# Patient Record
Sex: Female | Born: 1980 | Hispanic: No | Marital: Married | Smoking: Never smoker
Health system: Southern US, Community
[De-identification: ages and names within clinical notes are randomized; demographics above are authoritative.]

## PROBLEM LIST (undated history)

## (undated) ENCOUNTER — Inpatient Hospital Stay (HOSPITAL_COMMUNITY): Payer: Self-pay

## (undated) DIAGNOSIS — Z9089 Acquired absence of other organs: Secondary | ICD-10-CM

## (undated) DIAGNOSIS — Q529 Congenital malformation of female genitalia, unspecified: Secondary | ICD-10-CM

## (undated) HISTORY — PX: ADENOIDECTOMY: SUR15

## (undated) SURGERY — Surgical Case
Anesthesia: *Unknown

---

## 2009-03-30 ENCOUNTER — Encounter: Admission: RE | Admit: 2009-03-30 | Discharge: 2009-03-30 | Payer: Self-pay | Admitting: Specialist

## 2009-06-08 ENCOUNTER — Ambulatory Visit (HOSPITAL_COMMUNITY): Admission: RE | Admit: 2009-06-08 | Discharge: 2009-06-08 | Payer: Self-pay | Admitting: Internal Medicine

## 2011-11-24 ENCOUNTER — Ambulatory Visit (HOSPITAL_COMMUNITY)
Admission: RE | Admit: 2011-11-24 | Discharge: 2011-11-24 | Disposition: A | Discharge status: Home / Self Care | Payer: 59 | Source: Ambulatory Visit | Attending: Internal Medicine | Admitting: Internal Medicine

## 2011-11-24 ENCOUNTER — Other Ambulatory Visit (HOSPITAL_COMMUNITY): Payer: Self-pay | Admitting: Internal Medicine

## 2011-11-24 DIAGNOSIS — R52 Pain, unspecified: Secondary | ICD-10-CM

## 2011-11-24 DIAGNOSIS — M25569 Pain in unspecified knee: Secondary | ICD-10-CM | POA: Insufficient documentation

## 2011-11-24 DIAGNOSIS — M25469 Effusion, unspecified knee: Secondary | ICD-10-CM | POA: Insufficient documentation

## 2013-04-10 ENCOUNTER — Encounter (HOSPITAL_COMMUNITY): Payer: Self-pay | Admitting: Pharmacist

## 2013-04-14 NOTE — H&P (Signed)
Brooke Mendoza is an 32 y.o. female with extreme pain and difficulty with intercourse.  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    Pertinent Gynecological History: Menses: irregular occurring approximately every 26-32 days without intermenstrual spotting Bleeding: dysfunctional uterine bleeding Contraception: none DES exposure: unknown Blood transfusions: none Sexually transmitted diseases: no past history Previous GYN Procedures: none  Last mammogram: n/a Date: n/a Last pap: normal Date: 03/2013 OB History: G0   Menstrual History: Menarche age: 63 No LMP recorded.    No past medical history on file.  No past surgical history on file.  No family history on file.  Social History:  has no tobacco, alcohol, and drug history on file.  Allergies: No Known Allergies  No prescriptions prior to admission    ROS  Height 5\' 5"  (1.651 m), weight 189 lb 9.5 oz (86 kg). Physical Exam  Constitutional: She is oriented to person, place, and time. She appears well-developed and well-nourished.  Cardiovascular: Normal rate and regular rhythm.   Respiratory: Effort normal and breath sounds normal.  GI: Soft.  Neurological: She is alert and oriented to person, place, and time.  Skin: Skin is warm and dry.  Psychiatric: She has a normal mood and affect. Her behavior is normal.    No results found for this or any previous visit (from the past 24 hour(s)).  No results found.  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

## 2013-04-15 ENCOUNTER — Encounter (HOSPITAL_COMMUNITY)
Admission: RE | Disposition: A | Discharge status: Home / Self Care | Payer: Self-pay | Source: Ambulatory Visit | Attending: Obstetrics & Gynecology

## 2013-04-15 ENCOUNTER — Ambulatory Visit (HOSPITAL_COMMUNITY): Payer: 59 | Admitting: Anesthesiology

## 2013-04-15 ENCOUNTER — Ambulatory Visit (HOSPITAL_COMMUNITY)
Admission: RE | Admit: 2013-04-15 | Discharge: 2013-04-15 | Disposition: A | Discharge status: Home / Self Care | Payer: 59 | Source: Ambulatory Visit | Attending: Obstetrics & Gynecology | Admitting: Obstetrics & Gynecology

## 2013-04-15 ENCOUNTER — Encounter (HOSPITAL_COMMUNITY): Payer: Self-pay | Admitting: *Deleted

## 2013-04-15 ENCOUNTER — Encounter (HOSPITAL_COMMUNITY): Payer: 59 | Admitting: Anesthesiology

## 2013-04-15 DIAGNOSIS — IMO0002 Reserved for concepts with insufficient information to code with codable children: Secondary | ICD-10-CM | POA: Insufficient documentation

## 2013-04-15 DIAGNOSIS — N898 Other specified noninflammatory disorders of vagina: Secondary | ICD-10-CM | POA: Insufficient documentation

## 2013-04-15 DIAGNOSIS — Q529 Congenital malformation of female genitalia, unspecified: Secondary | ICD-10-CM

## 2013-04-15 DIAGNOSIS — Q524 Other congenital malformations of vagina: Secondary | ICD-10-CM

## 2013-04-15 HISTORY — DX: Other congenital malformations of vagina: Q52.4

## 2013-04-15 HISTORY — PX: HYMENECTOMY: SHX5853

## 2013-04-15 HISTORY — DX: Congenital malformation of female genitalia, unspecified: Q52.9

## 2013-04-15 LAB — CBC
HCT: 39.2 % (ref 36.0–46.0)
MCV: 85.6 fL (ref 78.0–100.0)
WBC: 6.1 10*3/uL (ref 4.0–10.5)

## 2013-04-15 SURGERY — HYMENECTOMY
Anesthesia: General | Site: Vagina | Wound class: Clean Contaminated

## 2013-04-15 MED ORDER — DEXAMETHASONE SODIUM PHOSPHATE 4 MG/ML IJ SOLN
INTRAMUSCULAR | Status: DC | PRN
  Administered 2013-04-15: 10 mg via INTRAVENOUS

## 2013-04-15 MED ORDER — MEPERIDINE HCL 25 MG/ML IJ SOLN: 6.2500 mg | INTRAMUSCULAR | Status: DC | PRN

## 2013-04-15 MED ORDER — IBUPROFEN 200 MG PO TABS: 800.0000 mg | ORAL_TABLET | Freq: Four times a day (QID) | ORAL | Status: DC | PRN

## 2013-04-15 MED ORDER — KETOROLAC TROMETHAMINE 30 MG/ML IJ SOLN
INTRAMUSCULAR | Status: DC | PRN
  Administered 2013-04-15: 30 mg via INTRAVENOUS

## 2013-04-15 MED ORDER — PROPOFOL 10 MG/ML IV EMUL
INTRAVENOUS | Status: AC
  Filled 2013-04-15: qty 20

## 2013-04-15 MED ORDER — PROPOFOL 10 MG/ML IV BOLUS
INTRAVENOUS | Status: DC | PRN
  Administered 2013-04-15: 150 mg via INTRAVENOUS

## 2013-04-15 MED ORDER — FENTANYL CITRATE 0.05 MG/ML IJ SOLN
INTRAMUSCULAR | Status: AC
  Filled 2013-04-15: qty 5

## 2013-04-15 MED ORDER — LIDOCAINE HCL (CARDIAC) 20 MG/ML IV SOLN
INTRAVENOUS | Status: AC
  Filled 2013-04-15: qty 5

## 2013-04-15 MED ORDER — LIDOCAINE HCL (CARDIAC) 20 MG/ML IV SOLN
INTRAVENOUS | Status: DC | PRN
  Administered 2013-04-15: 50 mg via INTRAVENOUS

## 2013-04-15 MED ORDER — ONDANSETRON HCL 4 MG/2ML IJ SOLN
INTRAMUSCULAR | Status: AC
  Filled 2013-04-15: qty 2

## 2013-04-15 MED ORDER — LACTATED RINGERS IV SOLN
INTRAVENOUS | Status: DC
  Administered 2013-04-15 (×2): via INTRAVENOUS

## 2013-04-15 MED ORDER — MIDAZOLAM HCL 2 MG/2ML IJ SOLN
INTRAMUSCULAR | Status: AC
  Filled 2013-04-15: qty 2

## 2013-04-15 MED ORDER — KETOROLAC TROMETHAMINE 30 MG/ML IJ SOLN: 15.0000 mg | Freq: Once | INTRAMUSCULAR | Status: DC | PRN

## 2013-04-15 MED ORDER — GLYCOPYRROLATE 0.2 MG/ML IJ SOLN
INTRAMUSCULAR | Status: AC
  Administered 2013-04-15: 0.2 mg via INTRAVENOUS
  Filled 2013-04-15: qty 1

## 2013-04-15 MED ORDER — MIDAZOLAM HCL 2 MG/2ML IJ SOLN
INTRAMUSCULAR | Status: DC | PRN
  Administered 2013-04-15: 1 mg via INTRAVENOUS

## 2013-04-15 MED ORDER — FENTANYL CITRATE 0.05 MG/ML IJ SOLN
INTRAMUSCULAR | Status: DC | PRN
  Administered 2013-04-15: 25 ug via INTRAVENOUS
  Administered 2013-04-15 (×3): 50 ug via INTRAVENOUS

## 2013-04-15 MED ORDER — KETOROLAC TROMETHAMINE 30 MG/ML IJ SOLN
INTRAMUSCULAR | Status: AC
  Filled 2013-04-15: qty 1

## 2013-04-15 MED ORDER — BUPIVACAINE HCL (PF) 0.5 % IJ SOLN
INTRAMUSCULAR | Status: AC
  Filled 2013-04-15: qty 30

## 2013-04-15 MED ORDER — ONDANSETRON HCL 4 MG/2ML IJ SOLN: 4.0000 mg | Freq: Once | INTRAMUSCULAR | Status: DC | PRN

## 2013-04-15 MED ORDER — ONDANSETRON HCL 4 MG/2ML IJ SOLN
INTRAMUSCULAR | Status: DC | PRN
  Administered 2013-04-15: 4 mg via INTRAVENOUS

## 2013-04-15 MED ORDER — 0.9 % SODIUM CHLORIDE (POUR BTL) OPTIME
TOPICAL | Status: DC | PRN
  Administered 2013-04-15: 1000 mL

## 2013-04-15 MED ORDER — DEXTROSE IN LACTATED RINGERS 5 % IV SOLN
INTRAVENOUS | Status: DC
  Administered 2013-04-15: 15:00:00 via INTRAVENOUS

## 2013-04-15 MED ORDER — OXYCODONE-ACETAMINOPHEN 7.5-325 MG PO TABS: 1.0000 | ORAL_TABLET | ORAL | Status: DC | PRN

## 2013-04-15 MED ORDER — FENTANYL CITRATE 0.05 MG/ML IJ SOLN: 25.0000 ug | INTRAMUSCULAR | Status: DC | PRN

## 2013-04-15 MED ORDER — GLYCOPYRROLATE 0.2 MG/ML IJ SOLN
0.2000 mg | Freq: Once | INTRAMUSCULAR | Status: AC
  Administered 2013-04-15: 0.2 mg via INTRAVENOUS

## 2013-04-15 MED ORDER — DEXAMETHASONE SODIUM PHOSPHATE 10 MG/ML IJ SOLN
INTRAMUSCULAR | Status: AC
  Filled 2013-04-15: qty 1

## 2013-04-15 SURGICAL SUPPLY — 25 items
BLADE SURG 15 STRL LF C SS BP (BLADE) ×1 IMPLANT
BLADE SURG 15 STRL SS (BLADE) ×1
CATH ROBINSON RED A/P 16FR (CATHETERS) ×2 IMPLANT
CLOTH BEACON ORANGE TIMEOUT ST (SAFETY) ×2 IMPLANT
COUNTER NEEDLE 1200 MAGNETIC (NEEDLE) ×2 IMPLANT
ELECT REM PT RETURN 9FT ADLT (ELECTROSURGICAL) ×2
ELECTRODE REM PT RTRN 9FT ADLT (ELECTROSURGICAL) ×1 IMPLANT
GLOVE BIO SURGEON STRL SZ 6 (GLOVE) ×2 IMPLANT
GLOVE BIOGEL PI IND STRL 6 (GLOVE) ×1 IMPLANT
GLOVE BIOGEL PI IND STRL 7.0 (GLOVE) ×1 IMPLANT
GLOVE BIOGEL PI INDICATOR 6 (GLOVE) ×1
GLOVE BIOGEL PI INDICATOR 7.0 (GLOVE) ×1
GOWN STRL REIN XL XLG (GOWN DISPOSABLE) ×4 IMPLANT
NEEDLE SPNL 22GX3.5 QUINCKE BK (NEEDLE) ×2 IMPLANT
NS IRRIG 1000ML POUR BTL (IV SOLUTION) IMPLANT
PACK VAGINAL MINOR WOMEN LF (CUSTOM PROCEDURE TRAY) ×2 IMPLANT
PAD OB MATERNITY 4.3X12.25 (PERSONAL CARE ITEMS) ×2 IMPLANT
PAD PREP 24X48 CUFFED NSTRL (MISCELLANEOUS) ×2 IMPLANT
PENCIL BUTTON HOLSTER BLD 10FT (ELECTRODE) ×2 IMPLANT
SUT VIC AB 3-0 SH 27 (SUTURE) ×2
SUT VIC AB 3-0 SH 27X BRD (SUTURE) ×2 IMPLANT
SUT VICRYL RAPIDE 4/0 PS 2 (SUTURE) IMPLANT
SYR CONTROL 10ML LL (SYRINGE) ×2 IMPLANT
TOWEL OR 17X24 6PK STRL BLUE (TOWEL DISPOSABLE) ×4 IMPLANT
WATER STERILE IRR 1000ML POUR (IV SOLUTION) ×2 IMPLANT

## 2013-04-15 NOTE — Op Note (Signed)
Brooke Mendoza PROCEDURE DATE: 04/15/2013  PREOPERATIVE DIAGNOSIS: Annular hymen   POSTOPERATIVE DIAGNOSIS: The same  PROCEDURE:  Hymenectomy  SURGEON:  Dr. Mitchel Honour  INDICATIONS: Brooke Mendoza is a 32 y.o. G0 with annular hymen and dyspareunia.    FINDINGS:  Annular hymen .   ANESTHESIA:    General   ESTIMATED BLOOD LOSS: <50 cc  SPECIMENS: None  COMPLICATIONS: None immediate  PROCEDURE IN DETAIL:  The patient was taken to the operating room where general anesthesia was administered and was found to be adequate.  After an adequate timeout was performed, she was placed in the dorsal lithotomy position and examined; then prepped and draped in the sterile manner.   Her bladder was catheterized for an unmeasured amount of clear, yellow urine.  Using the Bovie knife on cut, the annular hymen was removed from the 2-10 o'clock positions.  Using 3-0 Vicryl interrupted stitches, the vestibule was reaproximated and rendered hemostatic. At the conclusion of the case, 2 finger-breadths vaginal width was noted. There was good hemostasis at the end of the procedure.  The patient tolerated the procedure well.  All counts were correct x 2.  The patient went to the recovery room in stable condition.

## 2013-04-15 NOTE — Op Note (Signed)
No change to H&P. 

## 2013-04-15 NOTE — Anesthesia Preprocedure Evaluation (Signed)
Anesthesia Evaluation  Patient identified by MRN, date of birth, ID band Patient awake    Reviewed: Allergy & Precautions, H&P , NPO status , Patient's Chart, lab work & pertinent test results  Airway Mallampati: I TM Distance: >3 FB Neck ROM: full    Dental no notable dental hx. (+) Teeth Intact   Pulmonary neg pulmonary ROS,    Pulmonary exam normal       Cardiovascular negative cardio ROS      Neuro/Psych negative neurological ROS  negative psych ROS   GI/Hepatic negative GI ROS, Neg liver ROS,   Endo/Other  negative endocrine ROS  Renal/GU negative Renal ROS  negative genitourinary   Musculoskeletal negative musculoskeletal ROS (+)   Abdominal Normal abdominal exam  (+)   Peds  Hematology negative hematology ROS (+)   Anesthesia Other Findings   Reproductive/Obstetrics negative OB ROS                           Anesthesia Physical Anesthesia Plan  ASA: I  Anesthesia Plan: General   Post-op Pain Management:    Induction: Intravenous  Airway Management Planned: LMA  Additional Equipment:   Intra-op Plan:   Post-operative Plan:   Informed Consent: I have reviewed the patients History and Physical, chart, labs and discussed the procedure including the risks, benefits and alternatives for the proposed anesthesia with the patient or authorized representative who has indicated his/her understanding and acceptance.   Dental Advisory Given  Plan Discussed with: CRNA and Surgeon  Anesthesia Plan Comments:         Anesthesia Quick Evaluation

## 2013-04-15 NOTE — Anesthesia Postprocedure Evaluation (Signed)
  Anesthesia Post-op Note  Patient: Brooke Mendoza  Procedure(s) Performed: Procedure(s): HYMENECTOMY (N/A)  Patient Location: PACU  Anesthesia Type:General  Level of Consciousness: awake, alert  and oriented  Airway and Oxygen Therapy: Patient Spontanous Breathing  Post-op Pain: none  Post-op Assessment: Post-op Vital signs reviewed, Patient's Cardiovascular Status Stable, Respiratory Function Stable, Patent Airway, No signs of Nausea or vomiting and Pain level controlled  Post-op Vital Signs: Reviewed and stable  Complications: No apparent anesthesia complications

## 2013-04-15 NOTE — Transfer of Care (Signed)
Immediate Anesthesia Transfer of Care Note  Patient: Brooke Mendoza  Procedure(s) Performed: Procedure(s): HYMENECTOMY (N/A)  Patient Location: PACU  Anesthesia Type:General  Level of Consciousness: awake, oriented and patient cooperative  Airway & Oxygen Therapy: Patient Spontanous Breathing and Patient connected to nasal cannula oxygen  Post-op Assessment: Report given to PACU RN and Post -op Vital signs reviewed and stable  Post vital signs: stable  Complications: No apparent anesthesia complications

## 2013-04-16 ENCOUNTER — Encounter (HOSPITAL_COMMUNITY): Payer: Self-pay | Admitting: Obstetrics & Gynecology

## 2014-08-21 LAB — OB RESULTS CONSOLE RPR: RPR: NONREACTIVE

## 2014-08-21 LAB — OB RESULTS CONSOLE ABO/RH: RH Type: POSITIVE

## 2014-08-21 LAB — OB RESULTS CONSOLE GC/CHLAMYDIA
Chlamydia: NEGATIVE
GC PROBE AMP, GENITAL: NEGATIVE

## 2014-08-21 LAB — OB RESULTS CONSOLE HEPATITIS B SURFACE ANTIGEN: HEP B S AG: NEGATIVE

## 2014-08-21 LAB — OB RESULTS CONSOLE RUBELLA ANTIBODY, IGM: RUBELLA: IMMUNE

## 2014-08-21 LAB — OB RESULTS CONSOLE ANTIBODY SCREEN: Antibody Screen: NEGATIVE

## 2014-08-21 LAB — OB RESULTS CONSOLE HIV ANTIBODY (ROUTINE TESTING): HIV: NONREACTIVE

## 2014-08-21 LAB — OB RESULTS CONSOLE GBS: GBS: POSITIVE

## 2014-11-25 ENCOUNTER — Encounter (HOSPITAL_COMMUNITY): Payer: Self-pay

## 2014-11-25 ENCOUNTER — Observation Stay (HOSPITAL_COMMUNITY)
Admission: AD | Admit: 2014-11-25 | Discharge: 2014-11-27 | Disposition: A | Discharge status: Home / Self Care | Payer: 59 | Source: Ambulatory Visit | Attending: Obstetrics and Gynecology | Admitting: Obstetrics and Gynecology

## 2014-11-25 DIAGNOSIS — O26872 Cervical shortening, second trimester: Principal | ICD-10-CM | POA: Insufficient documentation

## 2014-11-25 DIAGNOSIS — Z3A23 23 weeks gestation of pregnancy: Secondary | ICD-10-CM | POA: Insufficient documentation

## 2014-11-25 DIAGNOSIS — O26879 Cervical shortening, unspecified trimester: Secondary | ICD-10-CM | POA: Diagnosis present

## 2014-11-25 MED ORDER — CALCIUM CARBONATE ANTACID 500 MG PO CHEW: 2.0000 | CHEWABLE_TABLET | ORAL | Status: DC | PRN

## 2014-11-25 MED ORDER — DOCUSATE SODIUM 100 MG PO CAPS
100.0000 mg | ORAL_CAPSULE | Freq: Every day | ORAL | Status: DC
  Administered 2014-11-27: 100 mg via ORAL
  Filled 2014-11-25 (×2): qty 1

## 2014-11-25 MED ORDER — ZOLPIDEM TARTRATE 5 MG PO TABS: 5.0000 mg | ORAL_TABLET | Freq: Every evening | ORAL | Status: DC | PRN

## 2014-11-25 MED ORDER — ACETAMINOPHEN 325 MG PO TABS: 650.0000 mg | ORAL_TABLET | ORAL | Status: DC | PRN

## 2014-11-25 MED ORDER — PRENATAL MULTIVITAMIN CH
1.0000 | ORAL_TABLET | Freq: Every day | ORAL | Status: DC
  Administered 2014-11-25 – 2014-11-26 (×2): 1 via ORAL
  Filled 2014-11-25 (×3): qty 1

## 2014-11-25 NOTE — H&P (Signed)
Brooke Mendoza is a 34 y.o. female presenting for monitoring and eval of cervical shortening.  Pt has uncomplicated past history, no fertiltiy issues, normal 1st tri screen and incidentally had cervical length at 18 wk anatomy scan with Cx 3.5 cm.  Followup US  At 22 weeks had cx length of 3.14 with slight funneling.  [place] today (23 weeks) shows cx length of 2.48cm and slight funneling.   Pt denies ctxs, pressure, and reports fetal movement.  GBS+ treated with augmentin from new ob workup. History OB History    Gravida Para Term Preterm AB TAB SAB Ectopic Multiple Living   1              History reviewed. No pertinent past medical history. Past Surgical History  Procedure Laterality Date  . Adenoidectomy    . Hymenectomy N/A 04/15/2013    Procedure: HYMENECTOMY;  Surgeon: Linda Hedges, DO;  Location: [facility];  Service: Gynecology;  Laterality: N/A;   Family History: family history is not on file. Social History:  reports that she has never smoked. She has never used smokeless tobacco. She reports that she does not drink alcohol or use illicit drugs.   Prenatal Transfer Tool  Maternal Diabetes: No Genetic Screening: Normal Maternal Ultrasounds/Referrals: Normal Fetal Ultrasounds or other Referrals:  None Maternal Substance Abuse:  No Significant Maternal Medications:  None Significant Maternal Lab Results:  None Other Comments:  None  ROS    Blood pressure 103/58, pulse 55, temperature 98.2 F (36.8 C), temperature source Oral, resp. rate 18. Exam Physical Exam  Prenatal labs: ABO, Rh:   Antibody:   Rubella:   RPR:    HBsAg:    HIV:    GBS:     Assessment/Plan: IUP at 22 6/7 Cx shortening and slight funneling on [place] (asymptomatic) No risk factors Plan overnight tocometry, and [place] and consultation with MFM tomorrow.   Wilfred Siverson C 11/25/2014, 5:58 PM

## 2014-11-26 ENCOUNTER — Observation Stay (HOSPITAL_COMMUNITY): Payer: 59

## 2014-11-26 DIAGNOSIS — Z3A23 23 weeks gestation of pregnancy: Secondary | ICD-10-CM | POA: Insufficient documentation

## 2014-11-26 DIAGNOSIS — O26872 Cervical shortening, second trimester: Secondary | ICD-10-CM | POA: Diagnosis not present

## 2014-11-27 ENCOUNTER — Observation Stay (HOSPITAL_COMMUNITY): Payer: 59

## 2014-11-27 DIAGNOSIS — O26872 Cervical shortening, second trimester: Secondary | ICD-10-CM | POA: Diagnosis not present

## 2014-11-27 NOTE — Discharge Instructions (Signed)

## 2014-11-27 NOTE — Discharge Summary (Signed)
Physician Discharge Summary  Patient ID: Brooke Mendoza MRN: [id number] DOB/AGE: 26-Oct-1980 34 y.o.  Admit date: 11/25/2014 Discharge date: 11/27/2014  Admission Diagnoses:  Shortened cervical length  Discharge Diagnoses:  Active Problems:   Cervix, short (affecting pregnancy)   [redacted] weeks gestation of pregnancy   Discharged Condition: stable  Hospital Course: Pt was admitted for evaluation for PTL after shortened cervix noted on ultrasound.  Repeat US was done showing length o f3.1 with no funneling.  No ctx noted on toco.  Stable for outpatient mngt  Consults: MFM  Significant Diagnostic Studies: labs: cbc  Treatments: rest, monitoring, ultrasound  Discharge Exam: Blood pressure 95/47, pulse 64, temperature 98.3 F (36.8 C), temperature source Oral, resp. rate 18, height 5' 5.5" (1.664 m), weight 196 lb 4.8 oz (89.041 kg). General appearance: alert and cooperative GI: gravid, NT  Disposition: 01-Home or Self Care     Medication List    STOP taking these medications        ibuprofen 200 MG tablet  Commonly known as:  ADVIL     oxyCODONE-acetaminophen 7.5-325 MG per tablet  Commonly known as:  PERCOCET      TAKE these medications        prenatal multivitamin Tabs tablet  Take 1 tablet by mouth at bedtime.         Signed: Kresta Templeman 11/27/2014, 1:16 PM

## 2014-11-27 NOTE — Consult Note (Signed)
Brooke Mendoza is a 34 year old, G1 at 23 weeks 1 day gestation presenting for monitoring and eval of cervical shortening. Pt has uncomplicated past history, no fertiltiy issues, normal 1st tri screen and incidentally had cervical length at 18 wk anatomy scan with Cx 3.5 cm. Followup US At 22 weeks had cx length of 3.14 with slight funneling. [place] prior to admission (23 weeks) shows cx length of 2.48cm and slight funneling. Pt denies ctxs, pressure, and reports fetal movement. GBS+ treated with augmentin from new ob workup. Ultrasound in our office yesterday 3.1cm in length   OB History    Gravida  Para  Term  Preterm  AB  TAB  SAB  Ectopic  Multiple  Living    1               History reviewed. No pertinent past medical history. Past Surgical History   Procedure  Laterality  Date   .  Adenoidectomy     .  Hymenectomy  N/A  04/15/2013     Procedure: HYMENECTOMY; Surgeon: Linda Hedges, DO; Location: [facility]; Service: Gynecology; Laterality: N/A;    Family History: family history is not on file. Social History:  reports that she has never smoked. She has never used smokeless tobacco. She reports that she does not drink alcohol or use illicit drugs.  Assessment/Plan: IUP at 23 17 Admission for cerivcal shortening and slight funneling on [place] (asymptomatic) with a normal cervical length yesterday in our office No risk factors  Weekly evaluation of cervical length through [redacted] weeks gestation No antenatal steroids currently indicated. Not currently a candidate for progesterone therapy  Questions appear answered and precautions for the above given. Spent greater than 1/2 of 35 minute visit face to face counseling with the couple

## 2014-11-27 NOTE — Progress Notes (Signed)
MFM did [place] yest>>consult this am for OPT mgmt recs

## 2014-12-01 ENCOUNTER — Inpatient Hospital Stay (HOSPITAL_COMMUNITY)
Admission: AD | Admit: 2014-12-01 | Discharge: 2014-12-03 | DRG: 781 | Disposition: A | Discharge status: Home / Self Care | Payer: 59 | Source: Ambulatory Visit | Attending: Obstetrics & Gynecology | Admitting: Obstetrics & Gynecology

## 2014-12-01 ENCOUNTER — Encounter (HOSPITAL_COMMUNITY): Payer: Self-pay | Admitting: *Deleted

## 2014-12-01 DIAGNOSIS — O26879 Cervical shortening, unspecified trimester: Secondary | ICD-10-CM

## 2014-12-01 DIAGNOSIS — O26872 Cervical shortening, second trimester: Principal | ICD-10-CM | POA: Diagnosis present

## 2014-12-01 DIAGNOSIS — Z3A24 24 weeks gestation of pregnancy: Secondary | ICD-10-CM | POA: Diagnosis present

## 2014-12-01 DIAGNOSIS — N883 Incompetence of cervix uteri: Secondary | ICD-10-CM

## 2014-12-01 DIAGNOSIS — O99820 Streptococcus B carrier state complicating pregnancy: Secondary | ICD-10-CM | POA: Diagnosis present

## 2014-12-01 LAB — US OB FOLLOW UP

## 2014-12-01 LAB — CBC
HCT: 35.5 % — ABNORMAL LOW (ref 36.0–46.0)
HEMOGLOBIN: 12.1 g/dL (ref 12.0–15.0)
MCH: 30.7 pg (ref 26.0–34.0)
MCHC: 34.1 g/dL (ref 30.0–36.0)
MCV: 90.1 fL (ref 78.0–100.0)
Platelets: 242 10*3/uL (ref 150–400)
RBC: 3.94 MIL/uL (ref 3.87–5.11)
RDW: 13.5 % (ref 11.5–15.5)
WBC: 10.8 10*3/uL — ABNORMAL HIGH (ref 4.0–10.5)

## 2014-12-01 LAB — TYPE AND SCREEN
ABO/RH(D): AB POS
Antibody Screen: NEGATIVE

## 2014-12-01 LAB — ABO/RH: ABO/RH(D): AB POS

## 2014-12-01 MED ORDER — ZOLPIDEM TARTRATE 5 MG PO TABS: 5.0000 mg | ORAL_TABLET | Freq: Every evening | ORAL | Status: DC | PRN

## 2014-12-01 MED ORDER — PRENATAL MULTIVITAMIN CH
1.0000 | ORAL_TABLET | Freq: Every day | ORAL | Status: DC
  Administered 2014-12-01 – 2014-12-02 (×2): 1 via ORAL
  Filled 2014-12-01 (×2): qty 1

## 2014-12-01 MED ORDER — DOCUSATE SODIUM 100 MG PO CAPS
100.0000 mg | ORAL_CAPSULE | Freq: Every day | ORAL | Status: DC
  Filled 2014-12-01: qty 1

## 2014-12-01 MED ORDER — PROGESTERONE MICRONIZED 200 MG PO CAPS
200.0000 mg | ORAL_CAPSULE | Freq: Every day | ORAL | Status: DC
  Administered 2014-12-01 – 2014-12-02 (×2): 200 mg via VAGINAL
  Filled 2014-12-01 (×2): qty 1

## 2014-12-01 MED ORDER — CALCIUM CARBONATE ANTACID 500 MG PO CHEW: 2.0000 | CHEWABLE_TABLET | ORAL | Status: DC | PRN

## 2014-12-01 MED ORDER — BETAMETHASONE SOD PHOS & ACET 6 (3-3) MG/ML IJ SUSP
12.0000 mg | INTRAMUSCULAR | Status: AC
  Administered 2014-12-01 – 2014-12-02 (×2): 12 mg via INTRAMUSCULAR
  Filled 2014-12-01 (×2): qty 2

## 2014-12-01 MED ORDER — ACETAMINOPHEN 325 MG PO TABS: 650.0000 mg | ORAL_TABLET | ORAL | Status: DC | PRN

## 2014-12-01 NOTE — H&P (Signed)
Brooke Mendoza is a 34 y.o. female presenting for management of short cervix.  Patient has been followed with borderline short cervix and funneling the last month.  She was admitted one week ago and MFM consult showed CL 3.1 and patient was discharged.  Repeat CL in office today was 1.7.  Patient reports no CTX, VB or LOF and active FM.  Active FM.  Otherwise normal antepartum course.  Maternal Medical History:  Fetal activity: Perceived fetal activity is normal.   Last perceived fetal movement was within the past hour.    Prenatal complications: no prenatal complications Prenatal Complications - Diabetes: none.    OB History    Gravida Para Term Preterm AB TAB SAB Ectopic Multiple Living   1              History reviewed. No pertinent past medical history. Past Surgical History  Procedure Laterality Date  . Adenoidectomy    . Hymenectomy N/A 04/15/2013    Procedure: HYMENECTOMY;  Surgeon: Linda Hedges, DO;  Location: [facility];  Service: Gynecology;  Laterality: N/A;   Family History: family history is not on file. Social History:  reports that she has never smoked. She has never used smokeless tobacco. She reports that she does not drink alcohol or use illicit drugs.   Prenatal Transfer Tool  Maternal Diabetes: No Genetic Screening: Normal Maternal Ultrasounds/Referrals: Normal Fetal Ultrasounds or other Referrals:  Referred to Materal Fetal Medicine  Maternal Substance Abuse:  No Significant Maternal Medications:  None Significant Maternal Lab Results:  Lab values include: Group B Strep positive Other Comments:  None  ROS    Blood pressure 97/55, pulse 55, temperature 99 F (37.2 C), temperature source Oral, resp. rate 18, height 5\' 6"  (1.676 m), weight 198 lb (89.812 kg). Maternal Exam:  Abdomen: Patient reports no abdominal tenderness. Fundal height is c/w dates.       Physical Exam  Constitutional: She is oriented to person, place, and time. She appears  well-developed and well-nourished.  GI: Soft. There is no rebound and no guarding.  Neurological: She is alert and oriented to person, place, and time.  Skin: Skin is warm and dry.  Psychiatric: She has a normal mood and affect. Her behavior is normal.    Prenatal labs: ABO, Rh: --/--/AB POS (06/13 1439) Antibody: NEG (06/13 1439) Rubella:   RPR:    HBsAg:    HIV:    GBS:     Assessment/Plan: 34yo G1 at [redacted]w[redacted]d with short cervix -Admit for BMZ -Start P4 supp -Bedrest -Repeat CL with MFM consult on Wednesday -SCDs -NST q shift  Nickolette Espinola 12/01/2014, 5:15 PM

## 2014-12-02 ENCOUNTER — Inpatient Hospital Stay (HOSPITAL_COMMUNITY): Payer: 59

## 2014-12-02 ENCOUNTER — Inpatient Hospital Stay (HOSPITAL_COMMUNITY)
Admit: 2014-12-02 | Discharge: 2014-12-02 | Disposition: A | Discharge status: Home / Self Care | Payer: 59 | Attending: Obstetrics and Gynecology | Admitting: Obstetrics and Gynecology

## 2014-12-02 ENCOUNTER — Ambulatory Visit (HOSPITAL_COMMUNITY)
Admit: 2014-12-02 | Discharge: 2014-12-02 | Disposition: A | Discharge status: Home / Self Care | Payer: 59 | Attending: Obstetrics and Gynecology | Admitting: Obstetrics and Gynecology

## 2014-12-02 NOTE — Consult Note (Signed)
Maternal Fetal Medicine Consultation  Requesting Provider(s): Brooke Queen, MD  Reason for consultation: Cervical shortening at 23+ weeks gestation  HPI: Brooke Mendoza is a 34 yo G1P0, EDD 03/25/2015 who is currently at 23w 6d admitted for a course of betamethasone due to shortened cervix.  See previous consult by Dr. Lawerance Cruel.  At the time of her anatomy ultrasound, some cervical funneling was noted.  Subsequent ultrasound showed a mildly shortened cervix of 2.48 cm.  Transvaginal ultrasound with MFM approximately one week ago showed a normal cervical length of 3.1 cm.  Yesterday, clinic ultrasound showed a cervical length of 1.7 cm with funneling.  The patient was admitted for a course of betamethasone and was started on vaginal progesterone.  She denies uterine contractions, vaginal bleeding or leakage of fluid.  She has been stable since admission and is currently without complaints.  Her prenatal course has otherwise been uncomplicated.  OB History: OB History    Gravida Para Term Preterm AB TAB SAB Ectopic Multiple Living   1               PMH: No past medical history on file.  PSH:  Past Surgical History  Procedure Laterality Date  . Adenoidectomy    . Hymenectomy N/A 04/15/2013    Procedure: HYMENECTOMY;  Surgeon: Linda Hedges, DO;  Location: [facility];  Service: Gynecology;  Laterality: N/A;   Meds:  Current Facility-Administered Medications on File Prior to Encounter  Medication Dose Route Frequency Provider Last Rate Last Dose  . acetaminophen (TYLENOL) tablet 650 mg  650 mg Oral Q4H PRN Linda Hedges, DO      . calcium carbonate (TUMS - dosed in mg elemental calcium) chewable tablet 400 mg of elemental calcium  2 tablet Oral Q4H PRN Linda Hedges, DO      . docusate sodium (COLACE) capsule 100 mg  100 mg Oral Daily Megan Morris, DO   100 mg at 12/01/14 1517  . prenatal multivitamin tablet 1 tablet  1 tablet Oral Q1200 Megan Morris, DO   1 tablet at 12/01/14 2132  .  progesterone (PROMETRIUM) capsule 200 mg  200 mg Vaginal QHS Megan Morris, DO   200 mg at 12/01/14 2132  . zolpidem (AMBIEN) tablet 5 mg  5 mg Oral QHS PRN Linda Hedges, DO       Current Outpatient Prescriptions on File Prior to Encounter  Medication Sig Dispense Refill  . Prenatal Vit-Fe Fumarate-FA (PRENATAL MULTIVITAMIN) TABS tablet Take 1 tablet by mouth at bedtime.     Allergies: No Known Allergies   FH: No family history on file.  Soc:  History   Social History  . Marital Status: Married    Spouse Name: N/A  . Number of Children: N/A  . Years of Education: N/A   Occupational History  . Not on file.   Social History Main Topics  . Smoking status: Never Smoker   . Smokeless tobacco: Never Used  . Alcohol Use: No  . Drug Use: No  . Sexual Activity: Yes   Other Topics Concern  . Not on file   Social History Narrative    GEN: well-appearing female ABD: gravid, NT  Ultrasound: Single IUP at 23w 6d Limited ultrasound performed for cervical length TVUS - cervical length 1.4 cm; some V-shaped funneling noted at the internal os Normal amniotic fluid volume  Labs: CBC    Component Value Date/Time   WBC 10.8* 12/01/2014 1439   RBC 3.94 12/01/2014 1439   HGB 12.1 12/01/2014  1439   HCT 35.5* 12/01/2014 1439   PLT 242 12/01/2014 1439   MCV 90.1 12/01/2014 1439   MCH 30.7 12/01/2014 1439   MCHC 34.1 12/01/2014 1439   RDW 13.5 12/01/2014 1439     A/P: 1) Single IUP at 23w 6d  2) Cervical shortening at 23w 6d - cervical length overall stable since admission. The patient is currently asymptomatic.  We had a long discussion regarding shortened cervix and management.  Based on gestational age, she is not a candidate for cerclage.  We briefly reviewed recent medical literature regarding pessary.  While initial studies showed some benefit in shortened cervix, more recent randomized controlled studies in [place] have shown no benefit with pessary.  The patient is clearly at  increased risk for preterm delivery, but the positive predictive value for shortened cervical length is poor - and she could go on to delivery at a much later gestational age.  Concur with addition of vaginal progesterone and the course of betamethasone.  Recommendations: 1) The patient and her husband had many questions regarding newborn outcomes based on gestational age.  While I do not feel that she is at imminent risk of delivery at this time, she would benefit from a NICU consult to discuss this further. 2) If the patient remains stable, would consider hospital discharge tomorrow after completing betamethasone.  The patient and her husband requested another cervical length tomorrow prior to discharge and feel that this would be reasonable.  If the cervical length is significantly shorted, would continue inpatient management / observation. 3) Recommend weekly cervical length surveillance until approx 26 weeks.  If the cervical length is significantly shorted (< 1 cm or less) or becomes symptomatic, would consider readmission.     Thank you for the opportunity to be a part of the care of [name]. Please contact our office if we can be of further assistance.   I spent approximately 30 minutes with this patient with over 50% of time spent in face-to-face counseling.  Benjaman Lobe, MD Maternal Fetal Medicine

## 2014-12-02 NOTE — Progress Notes (Signed)
Patient is doing well. No complaints.    BP 103/59 mmHg  Pulse 57  Temp(Src) 98.7 F (37.1 C) (Oral)  Resp 18  Ht 5\' 6"  (1.676 m)  Wt 89.812 kg (198 lb)  BMI 31.97 kg/m2 Results for orders placed or performed during the hospital encounter of 12/01/14 (from the past 24 hour(s))  CBC     Status: Abnormal   Collection Time: 12/01/14  2:39 PM  Result Value Ref Range   WBC 10.8 (H) 4.0 - 10.5 K/uL   RBC 3.94 3.87 - 5.11 MIL/uL   Hemoglobin 12.1 12.0 - 15.0 g/dL   HCT 35.5 (L) 36.0 - 46.0 %   MCV 90.1 78.0 - 100.0 fL   MCH 30.7 26.0 - 34.0 pg   MCHC 34.1 30.0 - 36.0 g/dL   RDW 13.5 11.5 - 15.5 %   Platelets 242 150 - 400 K/uL  Type and screen     Status: None   Collection Time: 12/01/14  2:39 PM  Result Value Ref Range   ABO/RH(D) AB POS    Antibody Screen NEG    Sample Expiration 12/04/2014   ABO/Rh     Status: None   Collection Time: 12/01/14  2:39 PM  Result Value Ref Range   ABO/RH(D) AB POS    General alert and oriented Sitting up and eating breakfast  IMPRESSION: IUP at 23 weeks Shortened cervix  PLAN: Continue bedrest Complete steroid series Continue progesterone MFM consult tomorrow to discuss inpatient versus outpatient management

## 2014-12-02 NOTE — Progress Notes (Signed)
Patient visiting with family and wished to have VS and be placed on monitor held until after they left.

## 2014-12-03 ENCOUNTER — Other Ambulatory Visit (HOSPITAL_COMMUNITY): Payer: Self-pay | Admitting: Obstetrics and Gynecology

## 2014-12-03 ENCOUNTER — Inpatient Hospital Stay (HOSPITAL_COMMUNITY)
Admit: 2014-12-03 | Discharge: 2014-12-03 | Disposition: A | Discharge status: Home / Self Care | Payer: 59 | Attending: Obstetrics and Gynecology | Admitting: Obstetrics and Gynecology

## 2014-12-03 DIAGNOSIS — N883 Incompetence of cervix uteri: Secondary | ICD-10-CM | POA: Insufficient documentation

## 2014-12-03 DIAGNOSIS — Z3A24 24 weeks gestation of pregnancy: Secondary | ICD-10-CM | POA: Insufficient documentation

## 2014-12-03 DIAGNOSIS — O26872 Cervical shortening, second trimester: Secondary | ICD-10-CM

## 2014-12-03 MED ORDER — PROGESTERONE MICRONIZED 200 MG PO CAPS: 200.0000 mg | ORAL_CAPSULE | Freq: Every day | ORAL | Status: DC

## 2014-12-03 NOTE — Discharge Instructions (Signed)
Pelvic Rest Pelvic rest is sometimes recommended for women when:   The placenta is partially or completely covering the opening of the cervix (placenta previa).  There is bleeding between the uterine wall and the amniotic sac in the first trimester (subchorionic hemorrhage).  The cervix begins to open without labor starting (incompetent cervix, cervical insufficiency).  The labor is too early (preterm labor). HOME CARE INSTRUCTIONS  Do not have sexual intercourse, stimulation, or an orgasm.  Do not use tampons, douche, or put anything in the vagina.  Do not lift anything over 10 pounds (4.5 kg).  Avoid strenuous activity or straining your pelvic muscles. SEEK MEDICAL CARE IF:  You have any vaginal bleeding during pregnancy. Treat this as a potential emergency.  You have cramping pain felt low in the stomach (stronger than menstrual cramps).  You notice vaginal discharge (watery, mucus, or bloody).  You have a low, dull backache.  There are regular contractions or uterine tightening. SEEK IMMEDIATE MEDICAL CARE IF: You have vaginal bleeding and have placenta previa.  Document Released: 10/01/2010 Document Revised: 08/29/2011 Document Reviewed: 10/01/2010 [facility] Patient Information 2015 [place], [place]. This information is not intended to replace advice given to you by your health care provider. Make sure you discuss any questions you have with your health care provider.

## 2014-12-03 NOTE — Discharge Summary (Signed)
Physician Discharge Summary  Patient ID: Brooke Mendoza MRN: [id number] DOB/AGE: 33-Jan-1982 33 y.o.  Admit date: 12/01/2014 Discharge date: 12/03/2014  Admission Diagnoses:cervical shortening  Discharge Diagnoses:  Active Problems:   Cervical shortening affecting pregnancy in second trimester, antepartum   Discharged Condition: stable  Hospital Course: admitted for observation and betamethasone. No bleeding/leaking/cramping. Repeat U/S today with MFM shows 2.7 cm cervix. Dr Lisbeth Renshaw of MFM agrees with discharge.  Consults: MFM  Significant Diagnostic Studies: radiology: Radiology  Treatments: betamethasone  Discharge Exam: Blood pressure 108/55, pulse 50, temperature 97.7 F (36.5 C), temperature source Oral, resp. rate 16, height 5\' 6"  (1.676 m), weight 198 lb 3.2 oz (89.903 kg). General appearance: alert, cooperative and no distress  Disposition: 01-Home or Self Care     Medication List    TAKE these medications        prenatal multivitamin Tabs tablet  Take 1 tablet by mouth at bedtime.     progesterone 200 MG capsule  Commonly known as:  PROMETRIUM  Place 1 capsule (200 mg total) vaginally at bedtime.         Signed: Ermalee Mealy II,Kimori Tartaglia E 12/03/2014, 9:23 AM

## 2014-12-10 ENCOUNTER — Other Ambulatory Visit (HOSPITAL_COMMUNITY): Payer: Self-pay | Admitting: Obstetrics and Gynecology

## 2014-12-10 ENCOUNTER — Ambulatory Visit (HOSPITAL_COMMUNITY)
Admission: RE | Admit: 2014-12-10 | Discharge: 2014-12-10 | Disposition: A | Discharge status: Home / Self Care | Payer: 59 | Source: Ambulatory Visit | Attending: Obstetrics and Gynecology | Admitting: Obstetrics and Gynecology

## 2014-12-10 ENCOUNTER — Encounter (HOSPITAL_COMMUNITY): Payer: Self-pay

## 2014-12-10 DIAGNOSIS — O26872 Cervical shortening, second trimester: Secondary | ICD-10-CM

## 2014-12-10 DIAGNOSIS — Z3A25 25 weeks gestation of pregnancy: Secondary | ICD-10-CM | POA: Diagnosis not present

## 2014-12-11 ENCOUNTER — Other Ambulatory Visit (HOSPITAL_COMMUNITY): Payer: Self-pay | Admitting: Obstetrics & Gynecology

## 2014-12-11 ENCOUNTER — Encounter (HOSPITAL_COMMUNITY): Payer: Self-pay | Admitting: Obstetrics & Gynecology

## 2014-12-12 ENCOUNTER — Other Ambulatory Visit (HOSPITAL_COMMUNITY): Payer: Self-pay | Admitting: Obstetrics and Gynecology

## 2014-12-12 ENCOUNTER — Ambulatory Visit (HOSPITAL_COMMUNITY)
Admission: RE | Admit: 2014-12-12 | Discharge: 2014-12-12 | Disposition: A | Discharge status: Home / Self Care | Payer: 59 | Source: Ambulatory Visit | Attending: Obstetrics and Gynecology | Admitting: Obstetrics and Gynecology

## 2014-12-12 DIAGNOSIS — O26872 Cervical shortening, second trimester: Secondary | ICD-10-CM | POA: Diagnosis not present

## 2014-12-19 ENCOUNTER — Ambulatory Visit (HOSPITAL_COMMUNITY)
Admission: RE | Admit: 2014-12-19 | Discharge: 2014-12-19 | Disposition: A | Discharge status: Home / Self Care | Payer: 59 | Source: Ambulatory Visit | Attending: Obstetrics and Gynecology | Admitting: Obstetrics and Gynecology

## 2014-12-19 ENCOUNTER — Encounter (HOSPITAL_COMMUNITY): Payer: Self-pay

## 2014-12-19 DIAGNOSIS — Z3A26 26 weeks gestation of pregnancy: Secondary | ICD-10-CM | POA: Diagnosis not present

## 2014-12-19 DIAGNOSIS — O26872 Cervical shortening, second trimester: Secondary | ICD-10-CM | POA: Insufficient documentation

## 2014-12-22 ENCOUNTER — Inpatient Hospital Stay (HOSPITAL_COMMUNITY): Payer: 59

## 2014-12-22 ENCOUNTER — Inpatient Hospital Stay (HOSPITAL_COMMUNITY)
Admission: AD | Admit: 2014-12-22 | Discharge: 2014-12-22 | Disposition: A | Discharge status: Home / Self Care | Payer: 59 | Source: Ambulatory Visit | Attending: Obstetrics and Gynecology | Admitting: Obstetrics and Gynecology

## 2014-12-22 ENCOUNTER — Encounter (HOSPITAL_COMMUNITY): Payer: Self-pay | Admitting: *Deleted

## 2014-12-22 DIAGNOSIS — Z3A26 26 weeks gestation of pregnancy: Secondary | ICD-10-CM | POA: Diagnosis not present

## 2014-12-22 DIAGNOSIS — O26872 Cervical shortening, second trimester: Secondary | ICD-10-CM | POA: Insufficient documentation

## 2014-12-22 DIAGNOSIS — R109 Unspecified abdominal pain: Secondary | ICD-10-CM

## 2014-12-22 DIAGNOSIS — O9989 Other specified diseases and conditions complicating pregnancy, childbirth and the puerperium: Secondary | ICD-10-CM | POA: Diagnosis not present

## 2014-12-22 DIAGNOSIS — R102 Pelvic and perineal pain: Secondary | ICD-10-CM | POA: Diagnosis not present

## 2014-12-22 DIAGNOSIS — O26899 Other specified pregnancy related conditions, unspecified trimester: Secondary | ICD-10-CM

## 2014-12-22 LAB — URINALYSIS, ROUTINE W REFLEX MICROSCOPIC
BILIRUBIN URINE: NEGATIVE
GLUCOSE, UA: NEGATIVE mg/dL
HGB URINE DIPSTICK: NEGATIVE
Ketones, ur: NEGATIVE mg/dL
Leukocytes, UA: NEGATIVE
Nitrite: NEGATIVE
Protein, ur: NEGATIVE mg/dL
Urobilinogen, UA: 0.2 mg/dL (ref 0.0–1.0)
pH: 6 (ref 5.0–8.0)

## 2014-12-22 NOTE — MAU Provider Note (Signed)
History     CSN: [id number]  Arrival date and time: 12/22/14 1000   First Provider Initiated Contact with Patient 12/22/14 1045      Chief Complaint  Patient presents with  . Abdominal Pain   HPI Brooke Mendoza 34 y.o. [redacted]w[redacted]d  Comes to MAU with constant pelvic heaviness and pressure since Saturday.  Feels the baby moving frequently.  Has noted pressure a couple of times a day previously but since Saturday, it has been almost constant.  Denies any leaking of fluid or vaginal bleeding.  Has notice the baby "balling up" a couple of times a day and that has not changed in the last week.  Unsure about contractions and discussed with client.  Denies any cramping in the lower abdomen.  Additionally has left sided sciatica pain going down the back of her left hip and back of left leg to the level of her knee.  Feels constantly sore and when moving in bed has some shooting pain.  Had sciatica a year ago when she was not pregnant.  Improved with change in physical activity - less stressful exercise and cleaning at home.  Accompanied by her mother and husband.  OB History    Gravida Para Term Preterm AB TAB SAB Ectopic Multiple Living   1               History reviewed. No pertinent past medical history.  Past Surgical History  Procedure Laterality Date  . Adenoidectomy    . Hymenectomy N/A 04/15/2013    Procedure: HYMENECTOMY;  Surgeon: Linda Hedges, DO;  Location: [facility];  Service: Gynecology;  Laterality: N/A;    History reviewed. No pertinent family history.  History  Substance Use Topics  . Smoking status: Never Smoker   . Smokeless tobacco: Never Used  . Alcohol Use: No    Allergies: No Known Allergies  Prescriptions prior to admission  Medication Sig Dispense Refill Last Dose  . Prenatal Vit-Fe Fumarate-FA (PRENATAL MULTIVITAMIN) TABS tablet Take 1 tablet by mouth at bedtime.   12/21/2014 at Unknown time  . progesterone (PROMETRIUM) 200 MG capsule Place 1 capsule (200 mg  total) vaginally at bedtime. 30 capsule 3 12/21/2014 at Unknown time    Review of Systems  Constitutional: Negative for fever.  Gastrointestinal: Negative for nausea and vomiting.       Has pelvic pressure and heaviness  Genitourinary:       No vaginal discharge. No vaginal bleeding. No leaking of fluid. No dysuria.   Physical Exam   Blood pressure 112/64, pulse 57, temperature 98.1 F (36.7 C), temperature source Oral, resp. rate 16, height 5\' 5"  (1.651 m), weight 200 lb 4 oz (90.833 kg), last menstrual period 06/18/2014.  Physical Exam  Nursing note and vitals reviewed. Constitutional: She is oriented to person, place, and time. She appears well-developed and well-nourished.  HENT:  Head: Normocephalic.  Eyes: EOM are normal.  Neck: Neck supple.  GI: Soft. There is no tenderness.  On monitor and FHT has moderate variability.  No contractions palpated and no contractions seen on the monitor strip.  Musculoskeletal: Normal range of motion.  Neurological: She is alert and oriented to person, place, and time.  Skin: Skin is warm and dry.  Psychiatric: She has a normal mood and affect.    MAU Course  Procedures  MDM 1105 - consult with Dr. Helane Rima by phone and reviewed the plan of care.  Will have [place] to check cervical length. Reviewed both ultrasounds  -  today's and Friday's with Dr. Helane Rima by phone - OK to discharge  Assessment and Plan  Pelvic pressure  Cervical shortening in second trimester   Plan Cervix is essentially the same as on Friday.  Reviewed with client.  From the slight changes and from the shortening of the cervix at this time in pregnancy, preterm delivery will likely occur.  Emphasized that every day the baby is in the womb is beneficial to the baby. Advised to continue the plan of care already established. Be aware of symptoms and if they worsen, be seen in the office or return to the hospital. Can use an ice pack or a heating pad for  sciatica.   Ellyse Rotolo 12/22/2014, 11:04 AM

## 2014-12-22 NOTE — MAU Note (Signed)
Pt back from U/S, no further EFM needed per Butch Penny NP

## 2014-12-22 NOTE — Discharge Instructions (Signed)
Continue the instructions you have been given in this pregnancy. Call your doctor or return with any worsening symptoms.

## 2014-12-22 NOTE — MAU Note (Signed)
Patient presents at [redacted] weeks gestation with c/o abdominal pain since Saturday. Fetus active. Denies bleeding or discharge.

## 2014-12-25 ENCOUNTER — Other Ambulatory Visit (HOSPITAL_COMMUNITY): Payer: Self-pay | Admitting: Maternal and Fetal Medicine

## 2014-12-25 DIAGNOSIS — O3412 Maternal care for benign tumor of corpus uteri, second trimester: Secondary | ICD-10-CM

## 2014-12-25 DIAGNOSIS — O26872 Cervical shortening, second trimester: Secondary | ICD-10-CM

## 2014-12-25 DIAGNOSIS — D259 Leiomyoma of uterus, unspecified: Secondary | ICD-10-CM

## 2014-12-26 ENCOUNTER — Encounter (HOSPITAL_COMMUNITY): Payer: Self-pay

## 2014-12-26 ENCOUNTER — Other Ambulatory Visit (HOSPITAL_COMMUNITY): Payer: Self-pay | Admitting: Maternal and Fetal Medicine

## 2014-12-26 ENCOUNTER — Ambulatory Visit (HOSPITAL_COMMUNITY)
Admission: RE | Admit: 2014-12-26 | Discharge: 2014-12-26 | Disposition: A | Discharge status: Home / Self Care | Payer: 59 | Source: Ambulatory Visit | Attending: Obstetrics and Gynecology | Admitting: Obstetrics and Gynecology

## 2014-12-26 DIAGNOSIS — O26872 Cervical shortening, second trimester: Secondary | ICD-10-CM | POA: Insufficient documentation

## 2014-12-26 DIAGNOSIS — D259 Leiomyoma of uterus, unspecified: Secondary | ICD-10-CM | POA: Insufficient documentation

## 2014-12-26 DIAGNOSIS — O3410 Maternal care for benign tumor of corpus uteri, unspecified trimester: Secondary | ICD-10-CM

## 2014-12-26 DIAGNOSIS — O3412 Maternal care for benign tumor of corpus uteri, second trimester: Secondary | ICD-10-CM

## 2014-12-26 DIAGNOSIS — O365920 Maternal care for other known or suspected poor fetal growth, second trimester, not applicable or unspecified: Secondary | ICD-10-CM | POA: Insufficient documentation

## 2014-12-26 DIAGNOSIS — Z3A00 Weeks of gestation of pregnancy not specified: Secondary | ICD-10-CM | POA: Insufficient documentation

## 2014-12-26 DIAGNOSIS — Z3A27 27 weeks gestation of pregnancy: Secondary | ICD-10-CM | POA: Insufficient documentation

## 2015-01-02 ENCOUNTER — Ambulatory Visit (HOSPITAL_COMMUNITY)
Admission: RE | Admit: 2015-01-02 | Discharge: 2015-01-02 | Disposition: A | Discharge status: Home / Self Care | Payer: 59 | Source: Ambulatory Visit | Attending: Obstetrics and Gynecology | Admitting: Obstetrics and Gynecology

## 2015-01-02 ENCOUNTER — Encounter (HOSPITAL_COMMUNITY): Payer: Self-pay

## 2015-01-02 DIAGNOSIS — O3412 Maternal care for benign tumor of corpus uteri, second trimester: Secondary | ICD-10-CM | POA: Diagnosis not present

## 2015-01-02 DIAGNOSIS — D259 Leiomyoma of uterus, unspecified: Secondary | ICD-10-CM

## 2015-01-02 DIAGNOSIS — O26872 Cervical shortening, second trimester: Secondary | ICD-10-CM | POA: Insufficient documentation

## 2015-01-16 ENCOUNTER — Encounter (HOSPITAL_COMMUNITY): Payer: Self-pay

## 2015-01-16 ENCOUNTER — Ambulatory Visit (HOSPITAL_COMMUNITY)
Admission: RE | Admit: 2015-01-16 | Discharge: 2015-01-16 | Disposition: A | Discharge status: Home / Self Care | Payer: 59 | Source: Ambulatory Visit | Attending: Obstetrics and Gynecology | Admitting: Obstetrics and Gynecology

## 2015-01-16 ENCOUNTER — Other Ambulatory Visit (HOSPITAL_COMMUNITY): Payer: Self-pay | Admitting: Maternal and Fetal Medicine

## 2015-01-16 DIAGNOSIS — O26872 Cervical shortening, second trimester: Secondary | ICD-10-CM | POA: Diagnosis not present

## 2015-01-16 DIAGNOSIS — O3412 Maternal care for benign tumor of corpus uteri, second trimester: Secondary | ICD-10-CM

## 2015-01-16 DIAGNOSIS — O365930 Maternal care for other known or suspected poor fetal growth, third trimester, not applicable or unspecified: Secondary | ICD-10-CM | POA: Insufficient documentation

## 2015-01-16 DIAGNOSIS — Z3A30 30 weeks gestation of pregnancy: Secondary | ICD-10-CM | POA: Insufficient documentation

## 2015-01-16 DIAGNOSIS — D259 Leiomyoma of uterus, unspecified: Secondary | ICD-10-CM

## 2015-01-28 ENCOUNTER — Other Ambulatory Visit (HOSPITAL_COMMUNITY): Payer: Self-pay | Admitting: Maternal and Fetal Medicine

## 2015-01-28 DIAGNOSIS — O26872 Cervical shortening, second trimester: Secondary | ICD-10-CM

## 2015-01-28 DIAGNOSIS — O365930 Maternal care for other known or suspected poor fetal growth, third trimester, not applicable or unspecified: Secondary | ICD-10-CM

## 2015-01-28 DIAGNOSIS — D259 Leiomyoma of uterus, unspecified: Secondary | ICD-10-CM

## 2015-01-28 DIAGNOSIS — Z3A32 32 weeks gestation of pregnancy: Secondary | ICD-10-CM

## 2015-01-28 DIAGNOSIS — O3410 Maternal care for benign tumor of corpus uteri, unspecified trimester: Secondary | ICD-10-CM

## 2015-02-02 ENCOUNTER — Encounter (HOSPITAL_COMMUNITY): Payer: Self-pay

## 2015-02-02 ENCOUNTER — Other Ambulatory Visit (HOSPITAL_COMMUNITY): Payer: Self-pay | Admitting: Maternal and Fetal Medicine

## 2015-02-02 ENCOUNTER — Ambulatory Visit (HOSPITAL_COMMUNITY)
Admission: RE | Admit: 2015-02-02 | Discharge: 2015-02-02 | Disposition: A | Discharge status: Home / Self Care | Payer: 59 | Source: Ambulatory Visit | Attending: Obstetrics and Gynecology | Admitting: Obstetrics and Gynecology

## 2015-02-02 VITALS — BP 112/65 | HR 55 | Wt 210.4 lb

## 2015-02-02 DIAGNOSIS — O365930 Maternal care for other known or suspected poor fetal growth, third trimester, not applicable or unspecified: Secondary | ICD-10-CM | POA: Diagnosis not present

## 2015-02-02 DIAGNOSIS — O3413 Maternal care for benign tumor of corpus uteri, third trimester: Secondary | ICD-10-CM

## 2015-02-02 DIAGNOSIS — D259 Leiomyoma of uterus, unspecified: Secondary | ICD-10-CM

## 2015-02-02 DIAGNOSIS — O3412 Maternal care for benign tumor of corpus uteri, second trimester: Secondary | ICD-10-CM | POA: Diagnosis not present

## 2015-02-02 DIAGNOSIS — O26873 Cervical shortening, third trimester: Secondary | ICD-10-CM

## 2015-02-02 DIAGNOSIS — O3410 Maternal care for benign tumor of corpus uteri, unspecified trimester: Secondary | ICD-10-CM

## 2015-02-02 DIAGNOSIS — O26872 Cervical shortening, second trimester: Secondary | ICD-10-CM

## 2015-02-02 DIAGNOSIS — Z3A32 32 weeks gestation of pregnancy: Secondary | ICD-10-CM

## 2015-02-02 DIAGNOSIS — O365931 Maternal care for other known or suspected poor fetal growth, third trimester, fetus 1: Secondary | ICD-10-CM

## 2015-02-03 ENCOUNTER — Other Ambulatory Visit (HOSPITAL_COMMUNITY): Payer: Self-pay | Admitting: *Deleted

## 2015-02-03 DIAGNOSIS — O365931 Maternal care for other known or suspected poor fetal growth, third trimester, fetus 1: Secondary | ICD-10-CM

## 2015-02-13 ENCOUNTER — Ambulatory Visit (HOSPITAL_COMMUNITY)
Admission: RE | Admit: 2015-02-13 | Discharge: 2015-02-13 | Disposition: A | Discharge status: Home / Self Care | Payer: 59 | Source: Ambulatory Visit | Attending: Obstetrics & Gynecology | Admitting: Obstetrics & Gynecology

## 2015-02-13 ENCOUNTER — Other Ambulatory Visit (HOSPITAL_COMMUNITY): Payer: Self-pay | Admitting: Maternal and Fetal Medicine

## 2015-02-13 ENCOUNTER — Encounter (HOSPITAL_COMMUNITY): Payer: Self-pay

## 2015-02-13 VITALS — BP 116/74 | HR 65 | Wt 213.0 lb

## 2015-02-13 DIAGNOSIS — O365931 Maternal care for other known or suspected poor fetal growth, third trimester, fetus 1: Secondary | ICD-10-CM

## 2015-02-13 DIAGNOSIS — Z3A34 34 weeks gestation of pregnancy: Secondary | ICD-10-CM | POA: Diagnosis not present

## 2015-02-13 DIAGNOSIS — O26873 Cervical shortening, third trimester: Secondary | ICD-10-CM

## 2015-02-13 DIAGNOSIS — O365930 Maternal care for other known or suspected poor fetal growth, third trimester, not applicable or unspecified: Secondary | ICD-10-CM | POA: Insufficient documentation

## 2015-02-13 DIAGNOSIS — O3413 Maternal care for benign tumor of corpus uteri, third trimester: Secondary | ICD-10-CM | POA: Insufficient documentation

## 2015-02-20 ENCOUNTER — Encounter (HOSPITAL_COMMUNITY): Payer: Self-pay

## 2015-02-20 ENCOUNTER — Other Ambulatory Visit (HOSPITAL_COMMUNITY): Payer: Self-pay | Admitting: Maternal and Fetal Medicine

## 2015-02-20 ENCOUNTER — Ambulatory Visit (HOSPITAL_COMMUNITY)
Admission: RE | Admit: 2015-02-20 | Discharge: 2015-02-20 | Disposition: A | Discharge status: Home / Self Care | Payer: 59 | Source: Ambulatory Visit | Attending: Obstetrics & Gynecology | Admitting: Obstetrics & Gynecology

## 2015-02-20 DIAGNOSIS — O26873 Cervical shortening, third trimester: Secondary | ICD-10-CM | POA: Insufficient documentation

## 2015-02-20 DIAGNOSIS — D259 Leiomyoma of uterus, unspecified: Secondary | ICD-10-CM

## 2015-02-20 DIAGNOSIS — O3413 Maternal care for benign tumor of corpus uteri, third trimester: Secondary | ICD-10-CM

## 2015-02-20 DIAGNOSIS — O365930 Maternal care for other known or suspected poor fetal growth, third trimester, not applicable or unspecified: Secondary | ICD-10-CM | POA: Diagnosis present

## 2015-02-20 DIAGNOSIS — Z3A35 35 weeks gestation of pregnancy: Secondary | ICD-10-CM

## 2015-02-24 ENCOUNTER — Inpatient Hospital Stay (HOSPITAL_COMMUNITY)
Admission: AD | Admit: 2015-02-24 | Discharge: 2015-02-24 | Disposition: A | Discharge status: Home / Self Care | Payer: 59 | Source: Ambulatory Visit | Attending: Obstetrics and Gynecology | Admitting: Obstetrics and Gynecology

## 2015-02-24 ENCOUNTER — Inpatient Hospital Stay (HOSPITAL_COMMUNITY): Payer: 59

## 2015-02-24 ENCOUNTER — Encounter (HOSPITAL_COMMUNITY): Payer: Self-pay

## 2015-02-24 DIAGNOSIS — O365930 Maternal care for other known or suspected poor fetal growth, third trimester, not applicable or unspecified: Secondary | ICD-10-CM | POA: Diagnosis not present

## 2015-02-24 DIAGNOSIS — Z3A35 35 weeks gestation of pregnancy: Secondary | ICD-10-CM | POA: Insufficient documentation

## 2015-02-24 DIAGNOSIS — O26873 Cervical shortening, third trimester: Secondary | ICD-10-CM | POA: Diagnosis not present

## 2015-02-24 DIAGNOSIS — O288 Other abnormal findings on antenatal screening of mother: Secondary | ICD-10-CM

## 2015-02-24 DIAGNOSIS — O368390 Maternal care for abnormalities of the fetal heart rate or rhythm, unspecified trimester, not applicable or unspecified: Secondary | ICD-10-CM

## 2015-02-24 NOTE — MAU Note (Signed)
Was seen by Dr. Radene Knee and sent here because was receiving NST and had deep variable, history of dynamic cervix.

## 2015-02-24 NOTE — MAU Provider Note (Signed)
Chief Complaint:  No chief complaint on file.   First Provider Initiated Contact with Patient 02/24/15 1349     HPI: Brooke Mendoza is a 34 y.o. G1P0 at [redacted]w[redacted]d who was sent to maternity admissions for further monitoring and BPP after variable decel in the office. Followed by MFM for growth restriction and short cervix.   Denies contractions, leakage of fluid or vaginal bleeding. Good fetal movement.   Patient Active Problem List   Diagnosis Date Noted  . Poor fetal growth affecting management of mother in third trimester, antepartum   . Uterine fibroids affecting pregnancy   . Cervical shortening during pregnancy in second trimester, antepartum    Past Medical History: History reviewed. No pertinent past medical history.  Past obstetric history: OB History  Gravida Para Term Preterm AB SAB TAB Ectopic Multiple Living  1             # Outcome Date GA Lbr Len/2nd Weight Sex Delivery Anes PTL Lv  1 Current               Past Surgical History: Past Surgical History  Procedure Laterality Date  . Adenoidectomy    . Hymenectomy N/A 04/15/2013    Procedure: HYMENECTOMY;  Surgeon: Linda Hedges, DO;  Location: [facility];  Service: Gynecology;  Laterality: N/A;     Family History: No family history on file.  Social History: Social History  Substance Use Topics  . Smoking status: Never Smoker   . Smokeless tobacco: Never Used  . Alcohol Use: No    Allergies: No Known Allergies  Meds:  Prescriptions prior to admission  Medication Sig Dispense Refill Last Dose  . Prenatal Vit-Fe Fumarate-FA (PRENATAL MULTIVITAMIN) TABS tablet Take 1 tablet by mouth at bedtime.   02/23/2015 at Unknown time  . progesterone (PROMETRIUM) 200 MG capsule Place 1 capsule (200 mg total) vaginally at bedtime. 30 capsule 3 02/23/2015 at Unknown time    I have reviewed patient's Past Medical Hx, Surgical Hx, Family Hx, Social Hx, medications and allergies.   ROS:  Review of Systems  Constitutional:  Negative for fever and chills.  Genitourinary: Negative for vaginal bleeding.       Neg for LOF. Pos FM.     Physical Exam   Patient Vitals for the past 24 hrs:  BP Temp Temp src Pulse Resp Height Weight  02/24/15 1341 105/77 mmHg 98 F (36.7 C) Oral 98 16 5\' 5"  (1.651 m) 216 lb (97.977 kg)   Constitutional: Well-developed, well-nourished female in no acute distress.  Cardiovascular: normal rate Respiratory: normal effort GI: Abd soft, non-tender, gravid appropriate for gestational age. MS: Extremities nontender, no edema, normal ROM Neurologic: Alert and oriented x 4.  GU: Deferred    FHT:  Baseline 135 , moderate variability, accelerations present, no decelerations Contractions: Frequent UI.   Labs: No results found for this or any previous visit (from the past 24 hour(s)).  Imaging:  BPP 8/8, AFI 12.97 cm, Vtx.   MAU Course: Prolonged monitoring, BPP  MDM: 34 year-old female w/ decel in office and growth restriction has had reactive extended monitoring and BPP 8/8. Discussed FHR tracing, BPP and follow-up tomorrow w/ MFM w/ Dr. Helane Rima. Testing reassuring.   Assessment: 1. Fetal heart rate decelerations affecting management of mother   2. Non-reactive NST (non-stress test)   3. Small for dates affecting management of mother, third trimester, not applicable or unspecified fetus    Plan: Discharge home in stable condition pr consult w/ Dr.  Grewal.  Preterm labor precautions and fetal kick counts.     Follow-up Information    Follow up with [facility] On 02/26/2015.   Specialty:  Maternal and Fetal Medicine   Why:  [facility] [place]   Contact information:   [address] [phone] [place]      Follow up with Cyril Mourning, MD In 1 week.   Specialty:  Obstetrics and Gynecology   Why:  Routine prenatal visit   Contact information:   [place]  [id number] [phone]         Medication List    TAKE these medications        prenatal multivitamin Tabs tablet  Take 1 tablet by mouth at bedtime.     progesterone 200 MG capsule  Commonly known as:  PROMETRIUM  Place 1 capsule (200 mg total) vaginally at bedtime.        [place], [place] 02/24/2015 3:45 PM

## 2015-02-24 NOTE — MAU Note (Signed)
Urine in lab 

## 2015-02-24 NOTE — Discharge Instructions (Signed)

## 2015-02-26 ENCOUNTER — Ambulatory Visit (HOSPITAL_COMMUNITY)
Admission: RE | Admit: 2015-02-26 | Discharge: 2015-02-26 | Disposition: A | Discharge status: Home / Self Care | Payer: 59 | Source: Ambulatory Visit | Attending: Obstetrics & Gynecology | Admitting: Obstetrics & Gynecology

## 2015-02-26 ENCOUNTER — Encounter (HOSPITAL_COMMUNITY): Payer: Self-pay

## 2015-02-26 DIAGNOSIS — Z3A00 Weeks of gestation of pregnancy not specified: Secondary | ICD-10-CM | POA: Insufficient documentation

## 2015-02-26 DIAGNOSIS — O365990 Maternal care for other known or suspected poor fetal growth, unspecified trimester, not applicable or unspecified: Secondary | ICD-10-CM | POA: Diagnosis present

## 2015-02-26 DIAGNOSIS — O365930 Maternal care for other known or suspected poor fetal growth, third trimester, not applicable or unspecified: Secondary | ICD-10-CM

## 2015-02-26 DIAGNOSIS — O365931 Maternal care for other known or suspected poor fetal growth, third trimester, fetus 1: Secondary | ICD-10-CM

## 2015-02-27 ENCOUNTER — Encounter (HOSPITAL_COMMUNITY): Payer: Self-pay | Admitting: *Deleted

## 2015-02-27 ENCOUNTER — Inpatient Hospital Stay (HOSPITAL_COMMUNITY)
Admission: AD | Admit: 2015-02-27 | Discharge: 2015-02-27 | Disposition: A | Discharge status: Home / Self Care | Payer: 59 | Source: Ambulatory Visit | Attending: Obstetrics and Gynecology | Admitting: Obstetrics and Gynecology

## 2015-02-27 DIAGNOSIS — Z3493 Encounter for supervision of normal pregnancy, unspecified, third trimester: Secondary | ICD-10-CM | POA: Diagnosis present

## 2015-02-27 NOTE — MAU Note (Signed)
PT SAYS   SHE WAS HERE   TODAY  AND HOME  AT 230PM.     ALSO IS  SEEN  AT MFM.     VE -   1CM IN OFFICE.     SHE  THINKS  SHE FEELS  UC.     FEELS VAG PAIN  AND PRESSURE.    - STARTED  TODAY. DENIES HSV AND MRSA.   GBS-     NOT  DONE

## 2015-03-04 ENCOUNTER — Ambulatory Visit (HOSPITAL_COMMUNITY): Payer: 59

## 2015-03-05 ENCOUNTER — Other Ambulatory Visit (HOSPITAL_COMMUNITY): Payer: Self-pay | Admitting: Maternal and Fetal Medicine

## 2015-03-05 ENCOUNTER — Encounter (HOSPITAL_COMMUNITY): Payer: Self-pay

## 2015-03-05 ENCOUNTER — Ambulatory Visit (HOSPITAL_COMMUNITY)
Admission: RE | Admit: 2015-03-05 | Discharge: 2015-03-05 | Disposition: A | Discharge status: Home / Self Care | Payer: 59 | Source: Ambulatory Visit | Attending: Obstetrics & Gynecology | Admitting: Obstetrics & Gynecology

## 2015-03-05 DIAGNOSIS — Z3A37 37 weeks gestation of pregnancy: Secondary | ICD-10-CM

## 2015-03-05 DIAGNOSIS — O3413 Maternal care for benign tumor of corpus uteri, third trimester: Secondary | ICD-10-CM

## 2015-03-05 DIAGNOSIS — D259 Leiomyoma of uterus, unspecified: Secondary | ICD-10-CM

## 2015-03-05 DIAGNOSIS — O365931 Maternal care for other known or suspected poor fetal growth, third trimester, fetus 1: Secondary | ICD-10-CM

## 2015-03-05 DIAGNOSIS — O365930 Maternal care for other known or suspected poor fetal growth, third trimester, not applicable or unspecified: Secondary | ICD-10-CM

## 2015-03-06 ENCOUNTER — Telehealth (HOSPITAL_COMMUNITY): Payer: Self-pay | Admitting: *Deleted

## 2015-03-06 NOTE — Telephone Encounter (Signed)
Preadmission screen  

## 2015-03-09 ENCOUNTER — Encounter (HOSPITAL_COMMUNITY): Payer: Self-pay | Admitting: *Deleted

## 2015-03-11 ENCOUNTER — Inpatient Hospital Stay (HOSPITAL_COMMUNITY): Payer: 59 | Admitting: Anesthesiology

## 2015-03-11 ENCOUNTER — Inpatient Hospital Stay (HOSPITAL_COMMUNITY)
Admission: RE | Admit: 2015-03-11 | Discharge: 2015-03-13 | DRG: 775 | Disposition: A | Discharge status: Home / Self Care | Payer: 59 | Source: Ambulatory Visit | Attending: Obstetrics and Gynecology | Admitting: Obstetrics and Gynecology

## 2015-03-11 ENCOUNTER — Encounter (HOSPITAL_COMMUNITY): Payer: Self-pay

## 2015-03-11 DIAGNOSIS — Z3A38 38 weeks gestation of pregnancy: Secondary | ICD-10-CM | POA: Diagnosis present

## 2015-03-11 DIAGNOSIS — Z8249 Family history of ischemic heart disease and other diseases of the circulatory system: Secondary | ICD-10-CM | POA: Diagnosis not present

## 2015-03-11 DIAGNOSIS — O26873 Cervical shortening, third trimester: Secondary | ICD-10-CM | POA: Diagnosis present

## 2015-03-11 DIAGNOSIS — O99824 Streptococcus B carrier state complicating childbirth: Secondary | ICD-10-CM | POA: Diagnosis present

## 2015-03-11 DIAGNOSIS — O2613 Low weight gain in pregnancy, third trimester: Secondary | ICD-10-CM | POA: Diagnosis present

## 2015-03-11 DIAGNOSIS — IMO0002 Reserved for concepts with insufficient information to code with codable children: Secondary | ICD-10-CM | POA: Diagnosis present

## 2015-03-11 HISTORY — DX: Acquired absence of other organs: Z90.89

## 2015-03-11 HISTORY — DX: Congenital malformation of female genitalia, unspecified: Q52.9

## 2015-03-11 LAB — CBC
HCT: 36.9 % (ref 36.0–46.0)
HEMOGLOBIN: 12.4 g/dL (ref 12.0–15.0)
MCH: 29.7 pg (ref 26.0–34.0)
MCHC: 33.6 g/dL (ref 30.0–36.0)
MCV: 88.3 fL (ref 78.0–100.0)
Platelets: 262 10*3/uL (ref 150–400)
RBC: 4.18 MIL/uL (ref 3.87–5.11)
RDW: 13.5 % (ref 11.5–15.5)
WBC: 8.6 10*3/uL (ref 4.0–10.5)

## 2015-03-11 LAB — TYPE AND SCREEN
ABO/RH(D): AB POS
ANTIBODY SCREEN: NEGATIVE

## 2015-03-11 LAB — RPR: RPR Ser Ql: NONREACTIVE

## 2015-03-11 MED ORDER — EPHEDRINE 5 MG/ML INJ
10.0000 mg | INTRAVENOUS | Status: DC | PRN
  Filled 2015-03-11: qty 2

## 2015-03-11 MED ORDER — OXYCODONE-ACETAMINOPHEN 5-325 MG PO TABS: 2.0000 | ORAL_TABLET | ORAL | Status: DC | PRN

## 2015-03-11 MED ORDER — ZOLPIDEM TARTRATE 5 MG PO TABS: 5.0000 mg | ORAL_TABLET | Freq: Every evening | ORAL | Status: DC | PRN

## 2015-03-11 MED ORDER — TETANUS-DIPHTH-ACELL PERTUSSIS 5-2.5-18.5 LF-MCG/0.5 IM SUSP: 0.5000 mL | Freq: Once | INTRAMUSCULAR | Status: DC

## 2015-03-11 MED ORDER — FENTANYL 2.5 MCG/ML BUPIVACAINE 1/10 % EPIDURAL INFUSION (WH - ANES)
14.0000 mL/h | INTRAMUSCULAR | Status: DC | PRN
  Administered 2015-03-11: 14 mL/h via EPIDURAL
  Filled 2015-03-11: qty 125

## 2015-03-11 MED ORDER — MEASLES, MUMPS & RUBELLA VAC ~~LOC~~ INJ: 0.5000 mL | INJECTION | Freq: Once | SUBCUTANEOUS | Status: DC

## 2015-03-11 MED ORDER — IBUPROFEN 600 MG PO TABS
600.0000 mg | ORAL_TABLET | Freq: Four times a day (QID) | ORAL | Status: DC
  Administered 2015-03-11 – 2015-03-13 (×9): 600 mg via ORAL
  Filled 2015-03-11 (×9): qty 1

## 2015-03-11 MED ORDER — OXYCODONE-ACETAMINOPHEN 5-325 MG PO TABS: 1.0000 | ORAL_TABLET | ORAL | Status: DC | PRN

## 2015-03-11 MED ORDER — OXYTOCIN BOLUS FROM INFUSION: 500.0000 mL | INTRAVENOUS | Status: DC

## 2015-03-11 MED ORDER — PENICILLIN G POTASSIUM 5000000 UNITS IJ SOLR
5.0000 10*6.[IU] | Freq: Once | INTRAVENOUS | Status: AC
  Administered 2015-03-11: 5 10*6.[IU] via INTRAVENOUS
  Filled 2015-03-11: qty 5

## 2015-03-11 MED ORDER — LANOLIN HYDROUS EX OINT: TOPICAL_OINTMENT | CUTANEOUS | Status: DC | PRN

## 2015-03-11 MED ORDER — MEDROXYPROGESTERONE ACETATE 150 MG/ML IM SUSP: 150.0000 mg | INTRAMUSCULAR | Status: DC | PRN

## 2015-03-11 MED ORDER — ONDANSETRON HCL 4 MG/2ML IJ SOLN: 4.0000 mg | Freq: Four times a day (QID) | INTRAMUSCULAR | Status: DC | PRN

## 2015-03-11 MED ORDER — BENZOCAINE-MENTHOL 20-0.5 % EX AERO
1.0000 "application " | INHALATION_SPRAY | CUTANEOUS | Status: DC | PRN
  Filled 2015-03-11: qty 56

## 2015-03-11 MED ORDER — ONDANSETRON HCL 4 MG/2ML IJ SOLN: 4.0000 mg | INTRAMUSCULAR | Status: DC | PRN

## 2015-03-11 MED ORDER — WITCH HAZEL-GLYCERIN EX PADS: 1.0000 "application " | MEDICATED_PAD | CUTANEOUS | Status: DC | PRN

## 2015-03-11 MED ORDER — ONDANSETRON HCL 4 MG PO TABS: 4.0000 mg | ORAL_TABLET | ORAL | Status: DC | PRN

## 2015-03-11 MED ORDER — TERBUTALINE SULFATE 1 MG/ML IJ SOLN
0.2500 mg | Freq: Once | INTRAMUSCULAR | Status: DC | PRN
  Filled 2015-03-11: qty 1

## 2015-03-11 MED ORDER — LIDOCAINE HCL (PF) 1 % IJ SOLN
30.0000 mL | INTRAMUSCULAR | Status: DC | PRN
  Filled 2015-03-11: qty 30

## 2015-03-11 MED ORDER — SENNOSIDES-DOCUSATE SODIUM 8.6-50 MG PO TABS
2.0000 | ORAL_TABLET | ORAL | Status: DC
  Administered 2015-03-11 – 2015-03-12 (×2): 2 via ORAL
  Filled 2015-03-11 (×3): qty 2

## 2015-03-11 MED ORDER — DIPHENHYDRAMINE HCL 25 MG PO CAPS: 25.0000 mg | ORAL_CAPSULE | Freq: Four times a day (QID) | ORAL | Status: DC | PRN

## 2015-03-11 MED ORDER — ACETAMINOPHEN 325 MG PO TABS: 650.0000 mg | ORAL_TABLET | ORAL | Status: DC | PRN

## 2015-03-11 MED ORDER — PHENYLEPHRINE 40 MCG/ML (10ML) SYRINGE FOR IV PUSH (FOR BLOOD PRESSURE SUPPORT)
80.0000 ug | PREFILLED_SYRINGE | INTRAVENOUS | Status: DC | PRN
  Administered 2015-03-11: 80 ug via INTRAVENOUS
  Filled 2015-03-11: qty 20
  Filled 2015-03-11: qty 2

## 2015-03-11 MED ORDER — LIDOCAINE HCL (PF) 1 % IJ SOLN
INTRAMUSCULAR | Status: DC | PRN
  Administered 2015-03-11 (×2): 8 mL via EPIDURAL

## 2015-03-11 MED ORDER — DIBUCAINE 1 % RE OINT: 1.0000 "application " | TOPICAL_OINTMENT | RECTAL | Status: DC | PRN

## 2015-03-11 MED ORDER — DIPHENHYDRAMINE HCL 50 MG/ML IJ SOLN: 12.5000 mg | INTRAMUSCULAR | Status: DC | PRN

## 2015-03-11 MED ORDER — LACTATED RINGERS IV SOLN: 500.0000 mL | INTRAVENOUS | Status: DC | PRN

## 2015-03-11 MED ORDER — CITRIC ACID-SODIUM CITRATE 334-500 MG/5ML PO SOLN: 30.0000 mL | ORAL | Status: DC | PRN

## 2015-03-11 MED ORDER — SIMETHICONE 80 MG PO CHEW: 80.0000 mg | CHEWABLE_TABLET | ORAL | Status: DC | PRN

## 2015-03-11 MED ORDER — OXYTOCIN 40 UNITS IN LACTATED RINGERS INFUSION - SIMPLE MED: 62.5000 mL/h | INTRAVENOUS | Status: DC

## 2015-03-11 MED ORDER — INFLUENZA VAC SPLIT QUAD 0.5 ML IM SUSY
0.5000 mL | PREFILLED_SYRINGE | INTRAMUSCULAR | Status: AC
  Administered 2015-03-13: 0.5 mL via INTRAMUSCULAR

## 2015-03-11 MED ORDER — PENICILLIN G POTASSIUM 5000000 UNITS IJ SOLR
2.5000 10*6.[IU] | INTRAVENOUS | Status: DC
  Administered 2015-03-11: 2.5 10*6.[IU] via INTRAVENOUS
  Filled 2015-03-11 (×5): qty 2.5

## 2015-03-11 MED ORDER — PRENATAL MULTIVITAMIN CH
1.0000 | ORAL_TABLET | Freq: Every day | ORAL | Status: DC
  Administered 2015-03-12 – 2015-03-13 (×2): 1 via ORAL
  Filled 2015-03-11 (×2): qty 1

## 2015-03-11 MED ORDER — LACTATED RINGERS IV SOLN
INTRAVENOUS | Status: DC
  Administered 2015-03-11 (×2): via INTRAVENOUS

## 2015-03-11 MED ORDER — OXYTOCIN 40 UNITS IN LACTATED RINGERS INFUSION - SIMPLE MED
1.0000 m[IU]/min | INTRAVENOUS | Status: DC
  Administered 2015-03-11: 8 m[IU]/min via INTRAVENOUS
  Administered 2015-03-11: 2 m[IU]/min via INTRAVENOUS
  Filled 2015-03-11: qty 1000

## 2015-03-11 NOTE — Progress Notes (Signed)
Called to room because of decreased FHT Upon my arrival, FHT 29s, and fetal vertex @ +2 station Pt pushed with good effort and only small movement. Recommended vacuum extraction and if no delivery - primary c-section Pt agreed Fetal scalp electrode removed and external monitor placed Vacuum applied and quick delivery with one easy pull Infant taken to warmer and supportive care given, NICU team evaluated Apgars 8,9 Placenta delivered spontaneously 2nd degree laceration repaired with 2-0 vicryl rapide Fundus firm, pt hemostatic Infant stable in room with parents

## 2015-03-11 NOTE — H&P (Addendum)
Brooke Mendoza is a 34 y.o. female G1 @ 38w presenting for IOL.  Pregnancy complicated by shortened cervix,poor maternal wt gain and AC lag (<3%)  History OB History    Gravida Para Term Preterm AB TAB SAB Ectopic Multiple Living   1              History reviewed. No pertinent past medical history. Past Surgical History  Procedure Laterality Date  . Adenoidectomy    . Hymenectomy N/A 04/15/2013    Procedure: HYMENECTOMY;  Surgeon: Linda Hedges, DO;  Location: [facility];  Service: Gynecology;  Laterality: N/A;   Family History: family history includes Cancer in her cousin; Diabetes in her father and paternal aunt; Heart disease in her father; Hypertension in her maternal aunt and maternal uncle; Seizures in her cousin. Social History:  reports that she has never smoked. She has never used smokeless tobacco. She reports that she does not drink alcohol or use illicit drugs.   Prenatal Transfer Tool  Maternal Diabetes: No Genetic Screening: Declined Maternal Ultrasounds/Referrals: Normal Fetal Ultrasounds or other Referrals:  Other: AC lag (<3%) Maternal Substance Abuse:  No Significant Maternal Medications:  None Significant Maternal Lab Results:  None Other Comments:  None  ROS    Blood pressure 106/80, pulse 93, temperature 97.9 F (36.6 C), temperature source Oral, resp. rate 18, last menstrual period 06/18/2014. Exam Physical Exam  Gen - NAD Abd - gravid, NT Ext - NT Cvx 3cm  Prenatal labs: ABO, Rh: --/--/AB POS, AB POS (06/13 1439) Antibody: NEG (06/13 1439) Rubella: Immune (03/03 0000) RPR: Nonreactive (03/03 0000)  HBsAg: Negative (03/03 0000)  HIV: Non-reactive (03/03 0000)  GBS: Positive (03/03 0000)   Assessment/Plan: 38 wks with AC lag, IOL rec by MFM Admit AROM Pitocin Exp mngt GBS antibiotic prophylaxis  ADKINS,GRETCHEN 03/11/2015, 7:57 AM

## 2015-03-11 NOTE — Anesthesia Procedure Notes (Signed)
Epidural Patient location during procedure: OB Start time: 03/11/2015 11:23 AM End time: 03/11/2015 11:27 AM  Staffing Anesthesiologist: Lyn Hollingshead Performed by: anesthesiologist   Preanesthetic Checklist Completed: patient identified, surgical consent, pre-op evaluation, timeout performed, IV checked, risks and benefits discussed and monitors and equipment checked  Epidural Patient position: sitting Prep: site prepped and draped and DuraPrep Patient monitoring: continuous pulse ox and blood pressure Approach: midline Location: L3-L4 Injection technique: LOR air  Needle:  Needle type: Tuohy  Needle gauge: 17 G Needle length: 9 cm and 9 Needle insertion depth: 7 cm Catheter type: closed end flexible Catheter size: 19 Gauge Catheter at skin depth: 12 cm Test dose: negative and Other  Assessment Sensory level: T9 Events: blood not aspirated, injection not painful, no injection resistance, negative IV test and no paresthesia  Additional Notes Reason for block:procedure for pain

## 2015-03-11 NOTE — Progress Notes (Signed)
Pt comfortable.  Not feeling ctx  FHT reassuring Toco occasional Cvx 3/70/-2, vtx AROM, clear  A/P:  Continue pitocin IOL PCN Exp mngt

## 2015-03-11 NOTE — Anesthesia Preprocedure Evaluation (Signed)

## 2015-03-12 ENCOUNTER — Encounter (HOSPITAL_COMMUNITY): Payer: Self-pay

## 2015-03-12 LAB — CBC
HCT: 32.8 % — ABNORMAL LOW (ref 36.0–46.0)
HEMOGLOBIN: 11.1 g/dL — AB (ref 12.0–15.0)
MCH: 30.1 pg (ref 26.0–34.0)
MCHC: 33.8 g/dL (ref 30.0–36.0)
MCV: 88.9 fL (ref 78.0–100.0)
PLATELETS: 224 10*3/uL (ref 150–400)
RBC: 3.69 MIL/uL — ABNORMAL LOW (ref 3.87–5.11)
RDW: 13.5 % (ref 11.5–15.5)
WBC: 11.5 10*3/uL — ABNORMAL HIGH (ref 4.0–10.5)

## 2015-03-12 NOTE — Lactation Note (Signed)
This note was copied from the chart of [place]. Lactation Consultation Note  Patient Name: Boy Rhonda Linan DHRCB'U Date: 03/12/2015  baby is 41 hours old , and is post circ today . Per mom baby didn't feed from 530 ish  To 130p  and started waking up. LC noted the last 15 min feeding was early this am around 330 am . Voided x1 , and HNS yet. @ this consult baby awake and had breast fed 3 x's since 1330  Per mom.  LC updated doc flow sheets otherwise had not tried due to baby being sleepy since circ, and no skin  To skin. LC reviewed basics of latching , importance of skin to skin feedings and depth with latch.  Also firm support and alignment . Baby opens wide and with [place] assist latches deeply. Swallows noted.  @ consult latched x2 , and the 2nd latch was the best , and the most depth and swallows. Per mom  Comfortable with latches. Baby released on his own and baby skin to skin after wards. LC also checked diaper , dry , old bleeding from circ site , but non at present.  Mother informed of post-discharge support and given phone number to the lactation department, including  services for phone call assistance; out-patient appointments; and breastfeeding support group. List of other  breastfeeding resources in the community given in the handout. Encouraged mother to call for problems or  concerns related to breastfeeding. Moms mom and sister present and very supportive. Mom wanted them to stay during consult.    Maternal Data    Feeding Feeding Type: Breast Fed Length of feed: 10 min (per mom )  LATCH Score/Interventions                      Lactation Tools Discussed/Used     Consult Status      Myer Haff 03/12/2015, 2:35 PM

## 2015-03-12 NOTE — Progress Notes (Signed)
S:  Patient is doing well.  O:  BP 113/64 mmHg  Pulse 62  Temp(Src) 98.9 F (37.2 C) (Oral)  Resp 18  Ht 5\' 5"  (1.651 m)  Wt 98.431 kg (217 lb)  BMI 36.11 kg/m2  SpO2 100%  LMP 06/18/2014  Breastfeeding? Unknown Abdomen is soft and non tender  Results for orders placed or performed during the hospital encounter of 03/11/15 (from the past 24 hour(s))  CBC     Status: Abnormal   Collection Time: 03/12/15  5:35 AM  Result Value Ref Range   WBC 11.5 (H) 4.0 - 10.5 K/uL   RBC 3.69 (L) 3.87 - 5.11 MIL/uL   Hemoglobin 11.1 (L) 12.0 - 15.0 g/dL   HCT 32.8 (L) 36.0 - 46.0 %   MCV 88.9 78.0 - 100.0 fL   MCH 30.1 26.0 - 34.0 pg   MCHC 33.8 30.0 - 36.0 g/dL   RDW 13.5 11.5 - 15.5 %   Platelets 224 150 - 400 K/uL   IMPRESSION: PPD #1 Doing well Circ today. Parents consented

## 2015-03-12 NOTE — Anesthesia Postprocedure Evaluation (Signed)
Anesthesia Post Note  Patient: Brooke Mendoza  Procedure(s) Performed: * No procedures listed *  Anesthesia type: Epidural  Patient location: Mother/Baby  Post pain: Pain level controlled  Post assessment: Post-op Vital signs reviewed  Last Vitals:  Filed Vitals:   03/11/15 2139  Pulse: 62  Temp: 37.2 C  Resp: 18    Post vital signs: Reviewed  Level of consciousness: awake  Complications: No apparent anesthesia complications

## 2015-03-13 ENCOUNTER — Ambulatory Visit (HOSPITAL_COMMUNITY): Payer: 59

## 2015-03-13 MED ORDER — IBUPROFEN 600 MG PO TABS: 600.0000 mg | ORAL_TABLET | Freq: Four times a day (QID) | ORAL | Status: DC | PRN

## 2015-03-13 NOTE — Progress Notes (Signed)
Post Partum Day 2 Subjective: no complaints, up ad lib, voiding, tolerating PO and + flatus  Objective: Blood pressure 120/67, pulse 48, temperature 98.2 F (36.8 C), temperature source Oral, resp. rate 16, height 5\' 5"  (1.651 m), weight 217 lb (98.431 kg), last menstrual period 06/18/2014, SpO2 98 %, unknown if currently breastfeeding.  Physical Exam:  General: alert, cooperative and no distress Lochia: appropriate Uterine Fundus: firm Incision: healing well DVT Evaluation: No evidence of DVT seen on physical exam.   Recent Labs  03/11/15 0755 03/12/15 0535  HGB 12.4 11.1*  HCT 36.9 32.8*    Assessment/Plan: Discharge home   LOS: 2 days   TOMBLIN II,JAMES E 03/13/2015, 9:17 AM

## 2015-03-13 NOTE — Discharge Summary (Signed)
Obstetric Discharge Summary Reason for Admission: induction of labor Prenatal Procedures: amniocentesis Intrapartum Procedures: vacuum extraction Postpartum Procedures: none Complications-Operative and Postpartum: none HEMOGLOBIN  Date Value Ref Range Status  03/12/2015 11.1* 12.0 - 15.0 g/dL Final   HCT  Date Value Ref Range Status  03/12/2015 32.8* 36.0 - 46.0 % Final    Physical Exam:  General: alert, cooperative and no distress Lochia: appropriate Uterine Fundus: firm Incision: healing well DVT Evaluation: No evidence of DVT seen on physical exam.  Discharge Diagnoses: Term Pregnancy-delivered  Discharge Information: Date: 03/13/2015 Activity: pelvic rest Diet: routine Medications: PNV and Ibuprofen Condition: stable Instructions: refer to practice specific booklet Discharge to: home   Newborn Data: Live born female  Birth Weight: 5 lb 9.6 oz (2540 g) APGAR: 8, 9  Home with mother.  TOMBLIN II,JAMES E 03/13/2015, 9:19 AM

## 2015-03-13 NOTE — Lactation Note (Signed)
This note was copied from the chart of [place]. Lactation Consultation Note  Patient Name: Brooke Mendoza [id number] Date: 03/13/2015 Reason for consult: Follow-up assessment;Infant < 6lbs    I assited mom with latching baby in cross cradle hold. He latched deeply with strong, rhythmic suckles, and good, consistent breast movement. No audible swallows heard, but appeared to be swallowing. He fed for a full 22 minutes, and then slept.  While pediatrician was in the room, I pointed out that the baby has an upper lip frenulum that extends to the gum line, and a posterior , mid frenulum that causes his tongue to form a deep bowl shape with elevation. This is causing some discomfort with latching for mom, and she has the beginning of a crack around the base of her right nipple. I advised mom to apply EBM,coconut oil, call her OB for all purpose nipple cream if this gest worse, call lactation for any questions/concerns, and also gave her a 20 nipple shield, and showed her how to apply, in case she needs this to protect her nipple and keep the baby latched and sucking. For now, he appears to be doing well.  I Mom will get a DEP since she has [employer], and I also showed her how to use a hand pump. I advised her to use either pump when too full, or engorged, to help with latch. I also advised mom to pre pump if very full, to help baby to latch and get more hind milk.  I made an o/p lactation appointment for mom for next Tuesday, 9/27.  Feeding Type: Breast Fed Length of feed: 22 min  LATCH Score/Interventions Latch: Grasps breast easily, tongue down, lips flanged, rhythmical sucking. Intervention(s): Teach feeding cues;Waking techniques;Skin to skin Intervention(s): Adjust position;Assist with latch  Audible Swallowing: A few with stimulation Intervention(s): Hand expression  Type of Nipple: Everted at rest and after stimulation (semi flat, cone shaped)  Comfort (Breast/Nipple):  Filling, red/small blisters or bruises, mild/mod discomfort  Problem noted: Cracked, bleeding, blisters, bruises Interventions  (Cracked/bleeding/bruising/blister): Expressed breast milk to nipple;Double electric pump;Hand pump  Hold (Positioning): Assistance needed to correctly position infant at breast and maintain latch. Intervention(s): Breastfeeding basics reviewed;Support Pillows;Position options;Skin to skin  LATCH Score: 7  Lactation Tools Discussed/Used Pump Review: Setup, frequency, and cleaning;Milk Storage Date initiated:: 03/13/15   Consult Status Consult Status: Follow-up Follow-up type: Out-patient    [name] 03/13/2015, 10:19 AM

## 2015-03-14 ENCOUNTER — Ambulatory Visit: Payer: Self-pay

## 2015-03-14 NOTE — Lactation Note (Signed)
This note was copied from the chart of [place]. Lactation Consultation Note  Patient Name: Brooke Mendoza Date: 03/14/2015 Reason for consult: Follow-up assessment;Infant weight loss;Other (Comment) (7% weight loss )  Baby is 71 hours old and 7% weight loss, DOC flow sheets WNL for D/C,  Mom has bee using a NS due to soreness and oral variance for baby , also since yesterday's consult. Per mom the NS is working well and is noting milk in the Nipple shield after baby feeds. @ consult mom does well applying the NS , and latching the baby with depth. Baby fed 20 mins , and mom released due to baby falling asleep. Nipple appeared snug in the NS , LC resized mom and noted the  #24 NS fit better. Baby awake and mom re- latched and the baby latched with depth and flanged lips, increased swallows noted.  Baby fed another 10 mins and mom released the suction , nipple appeared normal shape. LC also demo and instructed mom  How to use the curved tip syringe to instill EBM or formula into the top of the Nipple Shield for an appetizer.  Also discussed nutritive vs non - nutritive sucking patterns and the importance of releasing suction if non - nutritive to release suction.  Per mom has hand pump , and a DEBP at home. F/U apt for [facility] O/P was already made for Tuesday 9/27 @ 4 pm.  Mother informed of post-discharge support and given phone number to the lactation department, including services for phone call  assistance; out-patient appointments; and breastfeeding support group. List of other breastfeeding resources in the community given  in the handout. Encouraged mother to call for problems or concerns related to breastfeeding.   Maternal Data Has patient been taught Hand Expression?: Yes  Feeding Feeding Type: Breast Fed Length of feed: 20 min  LATCH Score/Interventions Latch: Grasps breast easily, tongue down, lips flanged, rhythmical sucking. Intervention(s): Skin to  skin;Teach feeding cues;Waking techniques Intervention(s): Adjust position;Assist with latch;Breast massage;Breast compression  Audible Swallowing: Spontaneous and intermittent  Type of Nipple: Everted at rest and after stimulation Intervention(s): Hand pump;Reverse pressure  Comfort (Breast/Nipple): Filling, red/small blisters or bruises, mild/mod discomfort  Problem noted: Filling;Mild/Moderate discomfort;Cracked, bleeding, blisters, bruises Interventions (Mild/moderate discomfort): Comfort gels  Hold (Positioning): Assistance needed to correctly position infant at breast and maintain latch. Intervention(s): Breastfeeding basics reviewed;Support Pillows;Position options;Skin to skin  LATCH Score: 8  Lactation Tools Discussed/Used Tools: Nipple Shields;Pump;Comfort gels Nipple shield size: 20;24;Other (comment) (was using a #20 NS at 1st , and LC noted it was snug , swiitched to #24 NS , better fit ) Breast pump type: Manual Pump Review: Milk Storage Initiated by:: MAI - LC reviewed  Date initiated:: 03/14/15   Consult Status Consult Status: Follow-up Date: 03/17/15 Follow-up type: Out-patient    Myer Haff 03/14/2015, 12:43 PM

## 2015-03-17 ENCOUNTER — Ambulatory Visit (HOSPITAL_COMMUNITY)
Admit: 2015-03-17 | Discharge: 2015-03-17 | Disposition: A | Discharge status: Home / Self Care | Payer: 59 | Attending: Pediatrics | Admitting: Pediatrics

## 2015-03-17 NOTE — Lactation Note (Addendum)
Lactation Consult  Mother's reason for visit: Mother wants assistance with latching infant and wants to learn how to use her electric pump.  Infant was [redacted] weeks gestation with IUGR, he weighted 5-9 at birth. On discharge he weighted 5-2.  Today weight is 5-5.9, 2436 on day 6 of life.  Mother has a history of infertility, she took two cycles of clomid before becoming pregnant. No history of PCOS Mother has cracks that are healing on both nipples.  Consult:  Initial Lactation Consultant:  Darla Lesches  ________________________________________________________________________    ________________________________________________________________________  Mother's Name: Brooke Mendoza Type of delivery:  Vaginal del Breastfeeding Experience:  none Maternal Medical Conditions: infertility Maternal Medications:  Prn for pain advil  ________________________________________________________________________  Breastfeeding History (Post Discharge)  Frequency of breastfeeding: every 2-4 hours Duration of feeding:  20-25 mins  Supplementation  Formula:  Volume 20 ml Frequency: after each breastfeeding       Brand: Alementum   Method:  Bottle  Infant Intake and Output Assessment  Voids:  4-5 in 24 hrs.  Color:  Clear yellow Stools:  3 in 24 hrs.  Color:  Yellow  ________________________________________________________________________  Maternal Breast Assessment  Breast:  Filling Nipple:  Erect Pain level:  2 Pain interventions:  Bra  _______________________________________________________________________ Feeding Assessment/Evaluation: infant breastfed for 5-10 mins and then fell asleep. He tires easily as if he is Near Term infant.    Infant's oral assessment:  Variance  Positioning:  Cross cradle Right breast  LATCH documentation:  Latch:  2 = Grasps breast easily, tongue down, lips flanged, rhythmical sucking.  Audible swallowing:  2 = Spontaneous and  intermittent  Type of nipple:  2 = Everted at rest and after stimulation  Comfort (Breast/Nipple):  1 = Filling, red/small blisters or bruises, mild/mod discomfort  Hold (Positioning):  1 = Assistance needed to correctly position infant at breast and maintain latch  LATCH score:  8  Attached assessment:  Shallow  Lips flanged:  No.  Lips untucked:  Yes.    Suck assessment:  Displays both   Pre-feed weight: 5-5.9,2436 Post-feed weight: 5-6.2,2442  Amount tranferred: 51ml     Additional Feeding Assessment - roused infant and latched in football hold. Infant sustained latch only for a few mins.   Infant's oral assessment:  Variance  Positioning:  Football Left breast  LATCH documentation:  Latch:  2 = Grasps breast easily, tongue down, lips flanged, rhythmical sucking.  Audible swallowing:  2 = Spontaneous and intermittent  Type of nipple:  2 = Everted at rest and after stimulation  Comfort (Breast/Nipple):  1 = Filling, red/small blisters or bruises, mild/mod discomfort  Hold (Positioning):  1 = Assistance needed to correctly position infant at breast and maintain latch  LATCH score:  Attached assessment:  Shallow  Lips flanged:  No.  Lips untucked:  Yes.    Suck assessment:  Displays both   Pre-feed weight: 2442 Post-feed weight:  2444 Amount transferred: 2 ml  Amount supplemented: 45-50 ml enfacare 22 cal formula   Total amount pumped post feed:  R 5 ml    L 5 ml  Total amount transferred: 6 ml Total supplement given: 45-50 ml, LC supplemented infant with a bottle.   Advised mother to breastfeed every 2-4 hours Informed parents of behaviors of Near Term infants Advised to breastfeed no longer than 30 mins and then supplement Suggested that parents supplement with 22 CAL formula until infant is over 6 lbs Mother to supplement infant with at  least 45-60 ml of formula after each breastfeeding Mother to post pump for 20 mins after each feeding and use EBM as her milk  comes to volume Mother was fit with #27 flanges for better fit. Advised mother to have family to support so she can get naps daily and drink lots of fld Follow up with [facility] on October, 1   At 4 p,m  Advised to follow up with Dr Jacklynn Ganong with any concerns and if infant not feeding well

## 2015-03-20 ENCOUNTER — Ambulatory Visit (HOSPITAL_COMMUNITY): Payer: 59

## 2015-03-24 ENCOUNTER — Ambulatory Visit (HOSPITAL_COMMUNITY): Payer: 59

## 2015-04-02 ENCOUNTER — Ambulatory Visit (HOSPITAL_COMMUNITY)
Admission: RE | Admit: 2015-04-02 | Discharge: 2015-04-02 | Disposition: A | Discharge status: Home / Self Care | Payer: 59 | Source: Ambulatory Visit | Attending: Obstetrics and Gynecology | Admitting: Obstetrics and Gynecology

## 2015-04-02 NOTE — Lactation Note (Signed)
Lactation Consult  Mother's reason for visit:  F/U for weight check and latch  Visit Type:  Feeding assessment  Appointment Notes:  38 week , < 6 , SN's , slow weight gain  Consult:  Follow-Up Lactation Consultant:  Myer Haff  ________________________________________________________________________ [name] Name: Brooke Mendoza Date of Birth: 03/11/2014 Pediatrician: Dr. Jacklynn Ganong  Gender: female Gestational Age: [redacted]w[redacted]d (At Birth) Birth Weight: 5 lb 9.6 oz (2540 g) Weight at Discharge:  Weight: (!) 5 lb 2.9 oz (2350 g) Date of Discharge: 03/14/2015 Filed Weights   03/11/15 2359 03/13/15 0001 03/14/15 0018  Weight: 5 lb 8.2 oz (2500 g) 5 lb 3.6 oz (2370 g) 5 lb 2.9 oz (2350 g)   Last weight taken from location outside of [facility]: 5-13 oz  Location:[facility] Weight today: 2912g , 6-6.7 oz   _______________________________________________  Mother's Name: Brooke Mendoza Type of delivery:  Vaginal Delivery  Breastfeeding Experience: 1st baby  Maternal Medical Conditions:  - no risk   ________________________________________________________________________  Breastfeeding History (Post Discharge)  Frequency of breastfeeding:  Every 2-3 hours . Sometimes when cluster feeding @ 1 1/2 hours  Duration of feeding:  Average 20 mins or greater , sometimes 10 -15 mins ( per mom )   Supplementing: Yes, per mom after 3 feedings a day supplementing with formula 30 ml   Pumping: per mom Medela DEBP - Last 3 days haven't pumped , last amount I was pumping was 30 ml total  And per mom felt discomfort with the pump , only able to turn it up 3 knotch. Per mom using the #27 Flange .   Infant Intake and Output Assessment  Voids: >6  in 24 hrs.  Color:  Clear yellow Stools:  Some times baby skips a day , and then has 2-3 in a day  in 24 hrs.  Color:  Brown and  Yellow  ________________________________________________________________________  Maternal Breast Assessment  Breast:  Soft Nipple:  Erect ( no break down of nipples noted , tissue appears healthy )  Pain level:  0 Pain interventions:  Expressed breast milk  _______________________________________________________________________ Feeding Assessment/Evaluation  Initial feeding assessment:  Infant's oral assessment:  Variance   Pre-feed weight: 2910 g , 6-6.7 oz  Wet diaper changed  Re-weight - pre-weight - 2900g , 6-6.3 oz  Post-feed weight: 2902g , 6-6.4 oz  Amount transferred:  2 ml    Added feeding - added SNS 25F feeding tube  Pre-weight - 2902g , 6-6.4 oz  Post weight - 2910 g  Amount transferred = 2 ml  Amount Supplemented with SNS= 6 ml  Amount Supplemented from bottle = 54 ml   Total amount pumped post feed:  Mom did not post pump   Total amount transferred: 30 ml  Amount transferred with SNS = 6 ml  Total supplement given:  54 ml   Lactation Impression - Low milk supply  Slow weight gain - ( over birth weight now)  Mom hasn't kept up with consistent pumping ( per mom hasn't pumped in several days )  Baby intermittently was non - nutritive and seemed top get frustrated with lack of flow ,  30 ml milk transfer is low    Lactation Plan Of care:  Per mom F/U with Dr. Jacklynn Ganong - 10/26  Breast feeding goals - continue to increase the weight of the baby  Work on increasing milk supply  Growth spurts - 3 weeks , 6 weeks, cluster  Feeding is normal  Option #1 -  Feed the baby 20 mins 1st breast and supplement 45-60 ml - post pump other breast  Option #2 - Feed at the breast 30 mins max, supplement after 45-60 ml  Option #3 - Feed at the breast with 3F SNS as discussed and shown 24 ml , and finish supplementing with a bottle - post pump  ( Per mom - I probably will wait until next week to sue the SNS ) ( the SNs actually got the baby in a nutritive pattern)  Extra  pumping- after 3-4 feedings once the baby is settled pump both breast 10 -15 mins , save milk and supplement back to baby. Don't watch the pump pieces while pumping( if needed olive oil or coconut oil to nipples before pumping decrease friction. Consider Fenugreek or Mother's Love ( Follow directions )   Stressed to mom and dad the baby should be taken at least 3 oz at each feeding to gain and do to low milk supply supplementing is indicated

## 2015-04-08 ENCOUNTER — Ambulatory Visit (HOSPITAL_COMMUNITY)
Admission: RE | Admit: 2015-04-08 | Discharge: 2015-04-08 | Disposition: A | Discharge status: Home / Self Care | Payer: 59 | Source: Ambulatory Visit | Attending: Obstetrics & Gynecology | Admitting: Obstetrics & Gynecology

## 2015-04-08 NOTE — Lactation Note (Signed)
Lactation Consult  Mother's reason for visit:  Mother was seen on October 13 for feeding assessment. Follow up for another feeding assessment today. Visit Type:Feeding assessment  Consult:  Follow-Up Lactation Consultant:  Jess Barters McCoy______________________________________________________________________  Brooke Mendoza Name: Brooke Mendoza Date of Birth: 03/11/2014 Pediatrician: Jacklynn Ganong Gender: female Gestational Age: [redacted]w[redacted]d (At Birth) Birth Weight: 5 lb 9.6 oz (2540 g) Weight at Discharge:  Weight: (!) 5 lb 2.9 oz (2350 g) Date of Discharge: 03/14/2015 Filed Weights   03/11/15 2359 03/13/15 0001 03/14/15 0018  Weight: 5 lb 8.2 oz (2500 g) 5 lb 3.6 oz (2370 g) 5 lb 2.9 oz (2350 g)   Weight today:6-15.9, 3174, Infant had a gain of 9 ounces in one week     ________________________________________________________________________  Mother's Name: Brooke Mendoza   Breastfeeding Experience: none Maternal Medical Conditions:  Infertility Maternal Medications:  Mother is eating fenugreek in cereal and deserts, pt mother makes a homemade cereal with coconut rice and fenugreek. Mother states she tries to eat every other day to increase milk supply  ________________________________________________________________________  Breastfeeding History (Post Discharge)  Frequency of breastfeeding:  6 times daily Duration of feeding:  20-30 mins  Supplementation  Formula:  Volume 45-60 ml Frequency:  3  Times daily        Brand: Alementum  Breastmilk:  Volume 45 ml Frequency:  3 times daily   Method:  Bottle  Infant Intake and Output Assessment  Voids: 10-12 in 24 hrs.  Color:  Clear yellow Stools: 1 in 24 hours,  Infant had 3 stools the day before yesterday  Yellow watery stool. .    ________________________________________________________________________  Maternal Breast Assessment  Breast:  Full Nipple:  Erect and 2 small milk blebs, slightly  tender Pain level:  1 Pain interventions:  Bra  _______________________________________________________________________ Feeding Assessment/Evaluation:  Mother states that infant may have reflux symptoms. She describes him making faces when lying on his back and sometimes coughing and chocking while breastfeeding. Discussed S/S of reflux and advised to talk with Peds about meds if continued.  Mother complaints of pain on the tip of the rt nipple. She observed 2 tiny white spots one week ago at the top of her nipple.  Advised mother in treatment to resolve clogged milk pores, (warm compresses to the tip of the nipple piror to breastfeeding). Advised in using coconut oil to massage in an upward motion to clear duct.   Mother states that are painful at times. She is not having pain at this time.   Mother is independently latching infant in cross cradle hold. Infant latched with with minimal assistance with adjusting infants lower lip. Mother is using frequent breast compression. Observed that mother has multiple forceful let down during feeding . Observed that infant does get frustrated and pulls of frequently.  Advised mother to limit breast compression to only when infant is not eliciting suckling. Infant transferred 26 ml from Rt breast. Mother placed infant on the left breast in cross cradle hold. Infant sustained latch for 10 mins and then became fussy. Advised mother in switching infants postioning . Infant sustained latch for another 5-10 misn. He transferred another 20 ml .   Infant's oral assessment:  Variance, infants tongue cups on all sides and he has a posterior tongue tie.   Positioning:  Cross cradle Right breast  LATCH documentation:  Latch:  2 = Grasps breast easily, tongue down, lips flanged, rhythmical sucking.  Audible swallowing:  2 = Spontaneous and intermittent  Type of nipple:  2 = Everted at rest and after stimulation  Comfort (Breast/Nipple):  1 = Filling, red/small  blisters or bruises, mild/mod discomfort  Hold (Positioning):  1 = Assistance needed to correctly position infant at breast and maintain latch  LATCH score:  8  Attached assessment:  Deep  Lips flanged:  Yes.    Lips untucked:  Yes.    Suck assessment:  Displays both  Pre-feed weight: 6-15.9, 3174, infant fed for 20 mins Post-feed weight:  7-0.9, 3200  Amount transferred: 26  ml   Infant's oral assessment:  Variance  Positioning:  Cross cradle Left breast  LATCH documentation:  Latch:  2 = Grasps breast easily, tongue down, lips flanged, rhythmical sucking.  Audible swallowing:  2 = Spontaneous and intermittent  Type of nipple:  2 = Everted at rest and after stimulation  Comfort (Breast/Nipple):  1 = Filling, red/small blisters or bruises, mild/mod discomfort  Hold (Positioning):  1 = Assistance needed to correctly position infant at breast and maintain latch  LATCH score:  8  Attached assessment:  Deep  Lips flanged:  Yes.    Lips untucked:  Yes.    Suck assessment:  Displays both    Pre-feed weight: 7-0.9, 3200  Post-feed weight:  7-1.6, 3220  Amount transferred:  20 ml Amount supplemented: 40 ml, father gave bottle of Alementum  Total feeding from breast 46 ml    Total amount transferred:  46 ml Total supplement given:  40 ml  Advised mother to continue to cue base feed at least 8-12 times in 24 hours Mother to allow infant to sleep 4-5 hours during the night , but wake infant within 4 hours for feedings during the day. Mother to continue to supplement infant with at least 45-60 ml after each breastfeeding Mother to continue to post pump 3 -4 times daily Advised to follow up with [facility] for feeding assessment within 2 weeks Mother was scheduled for a follow up on November 9 at [place] Mother is aware of available [facility] phone services and BFSG's

## 2015-04-29 ENCOUNTER — Ambulatory Visit (HOSPITAL_COMMUNITY)
Admission: RE | Admit: 2015-04-29 | Discharge: 2015-04-29 | Disposition: A | Discharge status: Home / Self Care | Payer: 59 | Source: Ambulatory Visit | Attending: Obstetrics & Gynecology | Admitting: Obstetrics & Gynecology

## 2015-04-29 NOTE — Lactation Note (Signed)
Lactation Consult  Mother's reason for visit: per mom do the fussiness at the breast and for some feedings not feeding long 5-10 mins  Visit Type:  Feeding assessment  Appointment Notes:  Corrected age 33 weeks of age / appt. Confirmed  Consult:  Follow-Up Lactation Consultant:  Myer Haff  ________________________________________________________________________ Brooke Mendoza Name: Brooke Mendoza Date of Birth: 03/11/2014 Pediatrician: DR, Jacklynn Ganong  Gender: female Gestational Age: [redacted]w[redacted]d (At Birth) Birth Weight: 5 lb 9.6 oz (2540 g) Weight at Discharge:  Weight: (!) 5 lb 2.9 oz (2350 g) Date of Discharge: 03/14/2015 Filed Weights   03/11/15 2359 03/13/15 0001 03/14/15 0018  Weight: 5 lb 8.2 oz (2500 g) 5 lb 3.6 oz (2370 g) 5 lb 2.9 oz (2350 g)   Last weight taken from location outside of [facility]: 11/7 Dr. Rex Kras at [place] couldn't remember weight  Location:Pediatrician's office Weight today: 9 -4 .0 oz , 4194 g    ________________________________________________________________  Mother's Name: Brooke Mendoza Type of delivery:  Vaginal Delivery  Breastfeeding Experience: 1st baby  Maternal Medical Conditions:   Maternal Medications:   ________________________________________________________________________  Breastfeeding History (Post Discharge)  Frequency of breastfeeding: 7-8 x's in 24 hours  Duration of feeding:  10 -20 mins and then supplement . ( pe rmom last week was a different story , and It seemed like I could satisfied the baby or console . So I introduced a pacifier. After feeding he sucks on  It like he is still hungry.  Sometimes feed on both breast and then supplement with EBM or formula   Supplement: 60 plus ml after breastfeeding with Alimentum   Pumping:DEBP Medela 3-4 x's a day , except last week . ( see LC impression below )   Infant Intake and Output Assessment  Voids:  >6  in 24 hrs.  Color:   Clear yellow Stools:  1  in 24 hrs.  Color:  Yellow  ________________________________________________________________________  Maternal Breast Assessment  Breast:  Filling Nipple:  Erect Pain level:  0 Pain interventions:  Expressed breast milk  _______________________________________________________________________ Feeding Assessment/Evaluation  Initial feeding assessment:  Infant's oral assessment:  Variance - short labial frenulum above  the gum line , upper lip stretches with oral exam and at the breast latched  LC suspects short posterior frenulum . Extends tongue over gum line short distance.   Positioning:  Cross cradle Right breast  LATCH documentation:  Latch:  2 = Grasps breast easily, tongue down, lips flanged, rhythmical sucking.  Audible swallowing:  2 = Spontaneous and intermittent  Type of nipple:  2 = Everted at rest and after stimulation  Comfort (Breast/Nipple):  1 = Filling, red/small blisters or bruises, mild/mod discomfort  Hold (Positioning):  1 = Assistance needed to correctly position infant at breast and maintain latch  LATCH score:  8 ( worked on depth with mom )   Attached assessment:  Shallow ( assisted with depth )   Lips flanged:  No. Flipped upper lip   Lips untucked:  Yes.    Suck assessment:  Nutritive  Tools:  None  Instructed on use and cleaning of tool:  No.  Pre-feed weight:  4194 g, 9-4.0 oz  Post-feed weight:  4222 g , 9-4.9 oz  Amount transferred:  28 ml  Amount supplemented:  None   Additional Feeding Assessment -   Infant's oral assessment:  Variance - see above note   Positioning:  Football Left breast  LATCH documentation:  Latch:  2 =  Grasps breast easily, tongue down, lips flanged, rhythmical sucking.  Audible swallowing:  2 = Spontaneous and intermittent  Type of nipple:  2 = Everted at rest and after stimulation  Comfort (Breast/Nipple):  1 = Filling, red/small blisters or bruises, mild/mod discomfort  Hold  (Positioning):  1 = Assistance needed to correctly position infant at breast and maintain latch  LATCH score: 8   Attached assessment:  Deep  Lips flanged:  Yes.    Lips untucked:  Yes.    Suck assessment:  Nutritive  Tools:  None  Instructed on use and cleaning of tool:  No.  Pre-feed weight:  4222 g , 9.4.9 oz  Post-feed weight:  4238 g ,l 9-5.5 oz  Amount transferred:16 ml  Amount supplemented: 60 ml ( alimentum from home )    Total amount pumped post feed:  Did not pump   Total amount transferred:  44 ml  Total supplement given:  60 ml  Total Volume: 104 ml   Per mom baby fed at the breast 1340 for 20 mins , also and hour later received   60 ml at 245pm , and LC appt. Was at 4 pm , feeding started at 430 pm    Lactation Consult Impression: and information mom gave at consult  Weight is increasing  Noted an oral variance- see note above - showed it to mom  Per mom baby recently was placed on Zantac .5 ml twice a day for reflux 11/7, haven't noticed a big difference yet  Baby is having to be supplemented due to challenging milk supply ( Alimentum )  Last week had to weight for shipment of formula , using Similiac , and baby got constipated for 5-7 days not stool .  Since the baby has been receiving Alimentum , bowel's are back to normal and not has gasey .  Per mom when baby was so gasey and having struggles with latching or was feeding often , did very little extra pumping.  LC suspected volume probably decreased due to lack of consistent latching and pumping.  ( this is moms 4 th [facility] consult and every consult it has been stressed to mom the importance of pumping to protect milk supply.  Also stressed to mom due the fact she has had challenges with milk supply early on and still a challenge  , she needs to consider  " focusing on what she can give her baby And not what she can't " breast milk wise " and supplementing and pumping are a must  and indicated to provide EBM for  her baby , protect milk supply .  Supplementing with formula is making of the volume she isn't producing at the breast so her baby will grow.  Braden Praised mom for her continual efforts breast feeding and encouraged her to keep up the pumping.   Lactation Plan Of Care: Per mom F/U with Dr. Jacklynn Ganong 11/28  Hester recommended to mom to consider attending the BFSG on Monday Evening at 7 pm or Tuesday's at 11am at [facility] milk supply  Extra pumping - important  After 3-4 feedings a day post pump 10 -15 mins both breast - save milk and feed it back to baby  Gradually increase volume of supplementing 10 -15 ml at a feeding if baby is still hungry and tolerating it.  Watch for spitting Or re- latch at the breast  Check out Websites for Frenulum issues - hand out

## 2015-06-23 DIAGNOSIS — H5213 Myopia, bilateral: Secondary | ICD-10-CM | POA: Diagnosis not present

## 2015-07-06 MED FILL — PRENATE MINI SOFTGEL: 18-0.6-0.4- | 30 days supply | Qty: 30 | Fill #2

## 2015-08-31 MED FILL — PRENATE MINI SOFTGEL: 18-0.6-0.4- | 30 days supply | Qty: 30 | Fill #0

## 2016-04-19 DIAGNOSIS — Z01419 Encounter for gynecological examination (general) (routine) without abnormal findings: Secondary | ICD-10-CM | POA: Diagnosis not present

## 2016-04-19 DIAGNOSIS — Z6832 Body mass index (BMI) 32.0-32.9, adult: Secondary | ICD-10-CM | POA: Diagnosis not present

## 2016-05-20 IMAGING — US US MFM UA CORD DOPPLER
1 series · 15 of 17 positions shown · non-contrast
Comparison: none

[Series 1: us mfm ua cord doppler · 17 acquisitions, 15 frames shown]
[im 1/17]
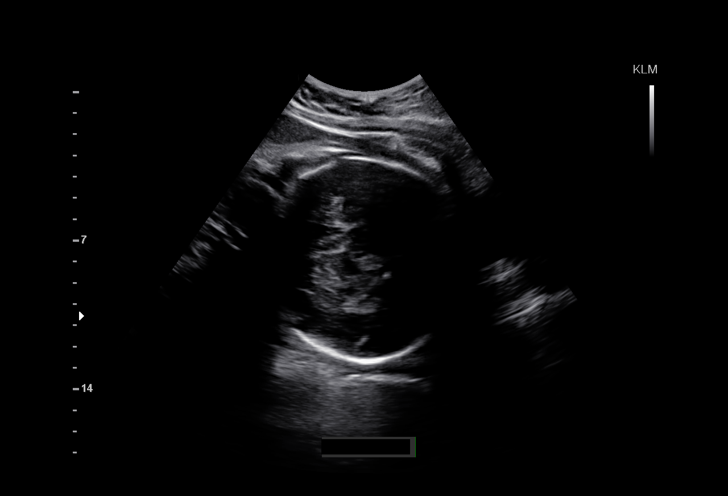
[im 2/17]
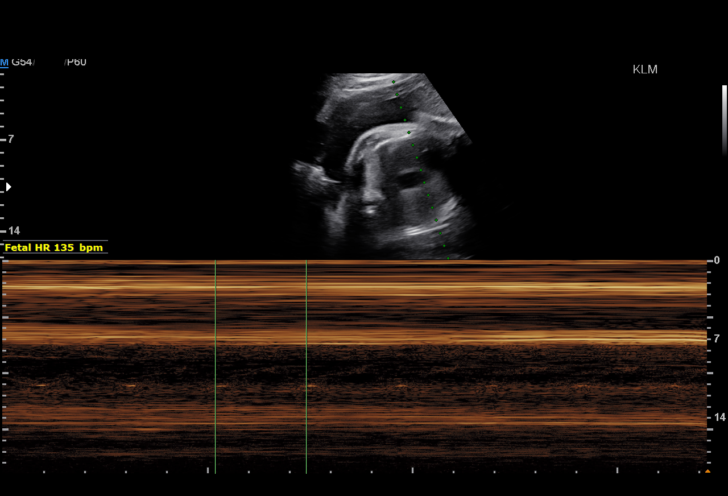
[im 3/17]
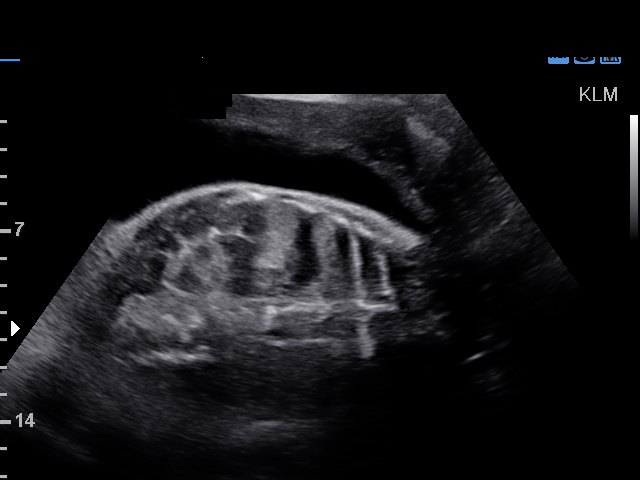
[im 4/17]
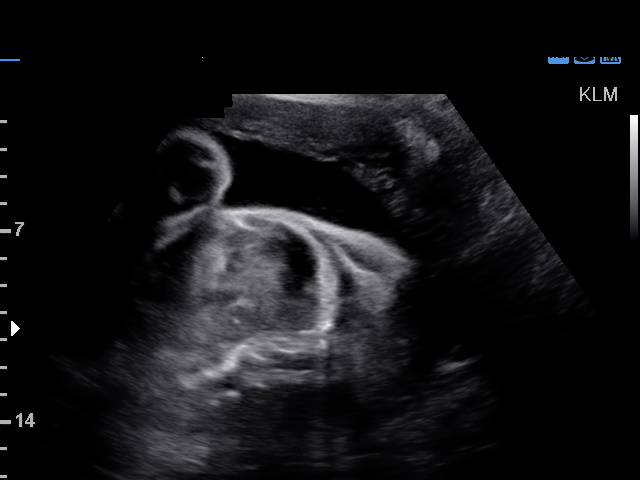
[im 6/17]
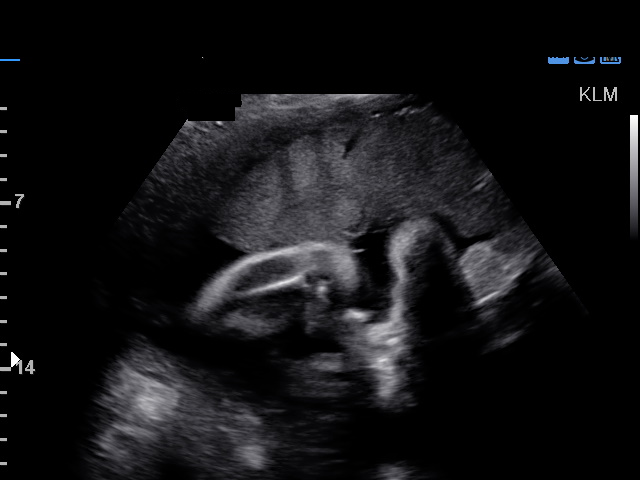
[im 7/17]
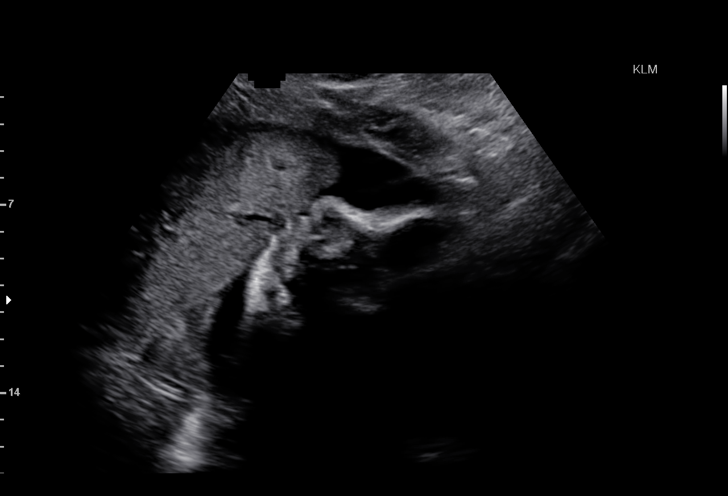
[im 8/17]
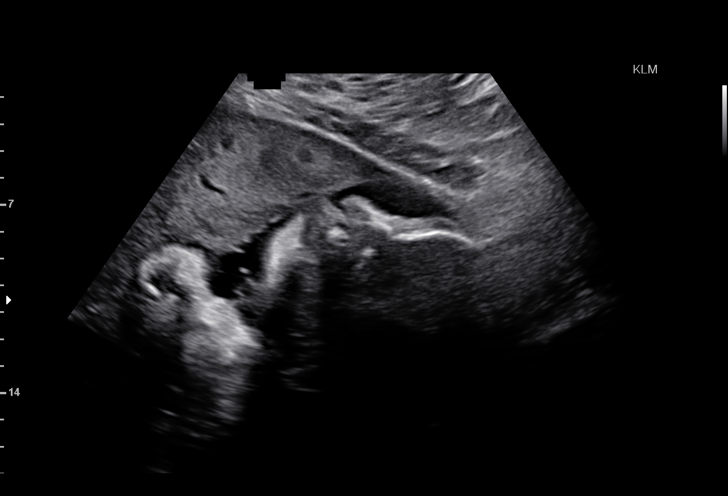
[im 9/17]
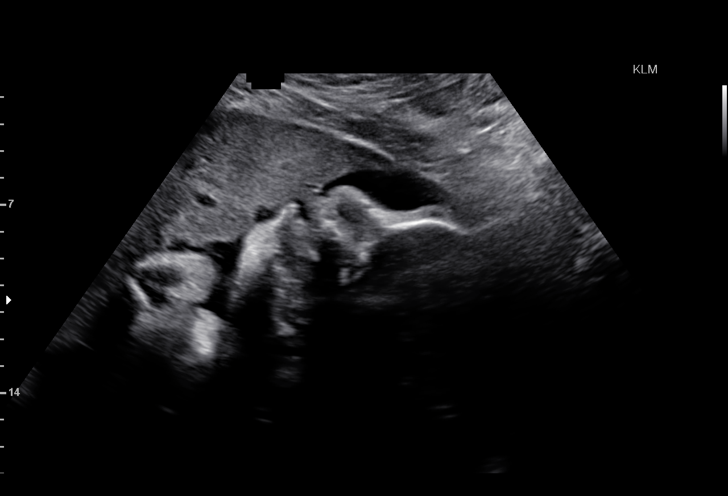
[im 10/17]
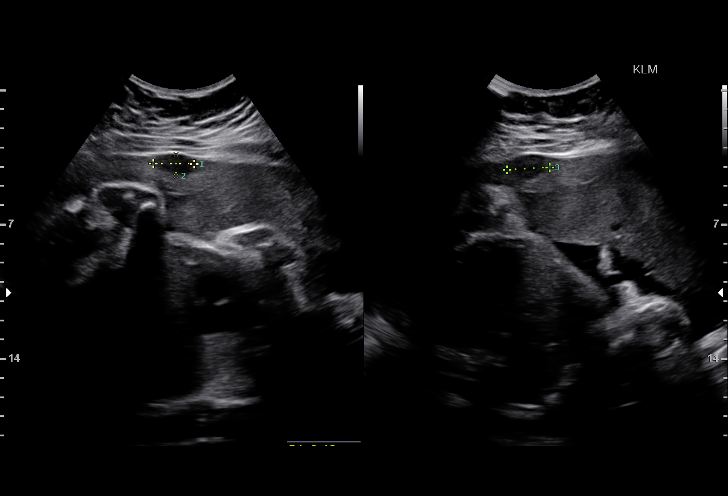
[im 11/17]
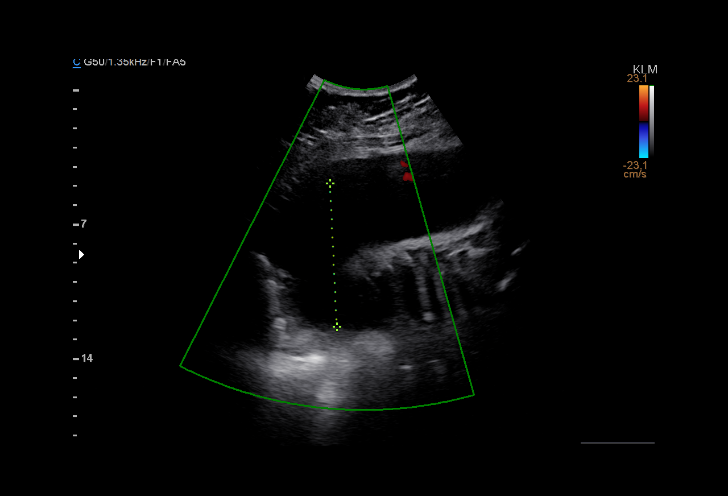
[im 12/17]
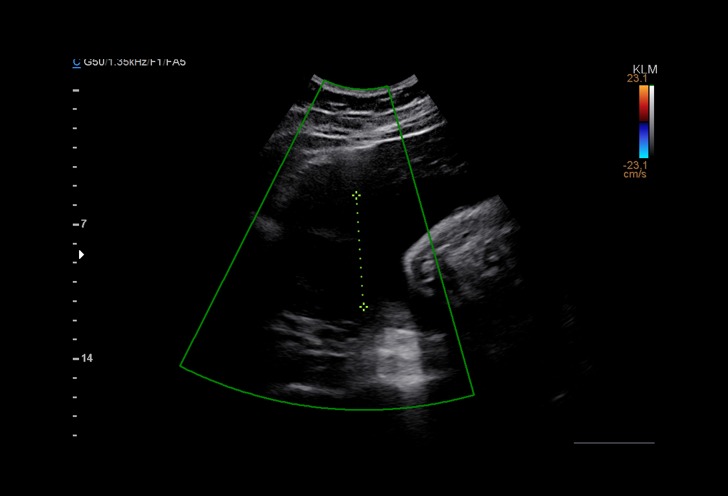
[im 14/17]
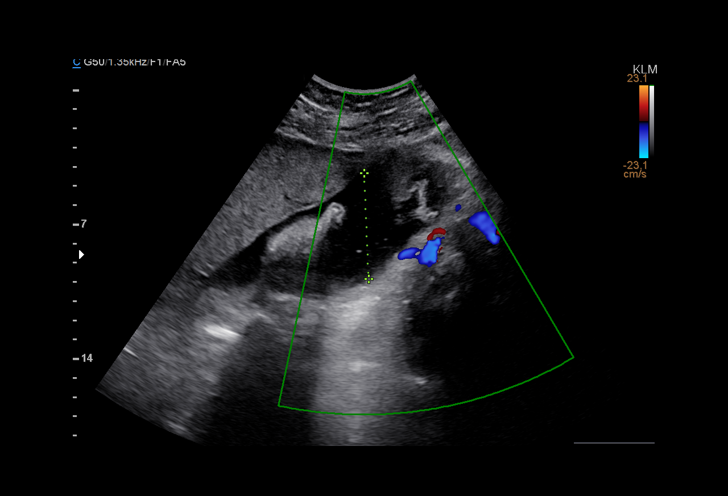
[im 15/17]
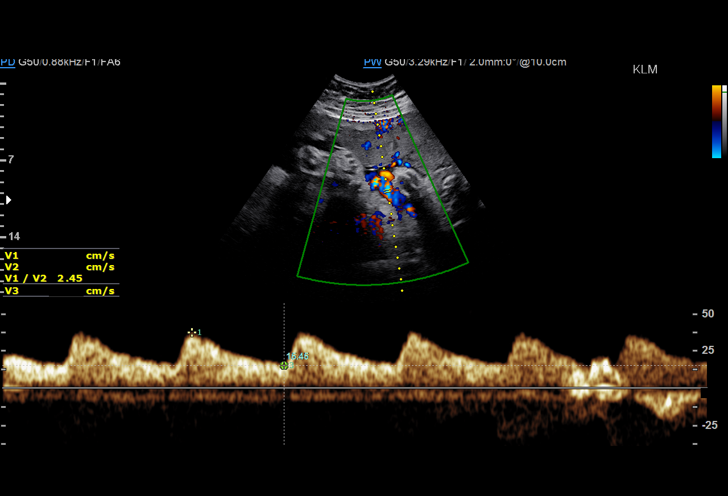
[im 16/17]
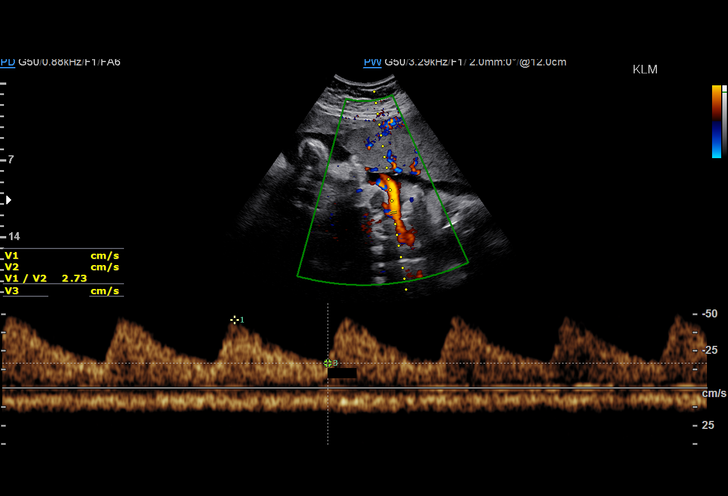
[im 17/17]
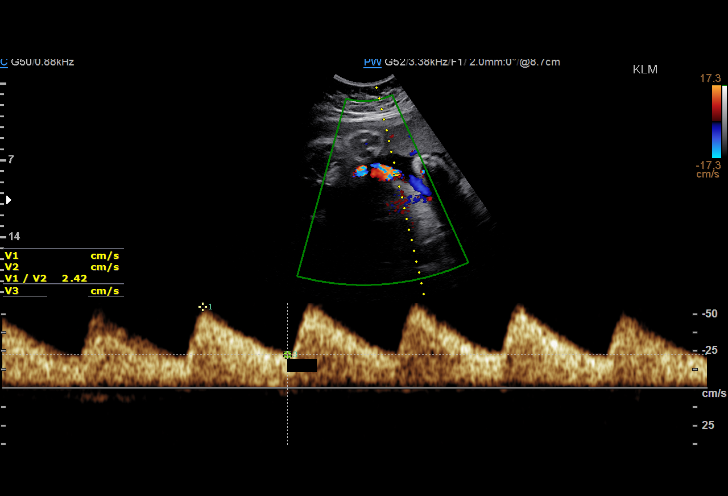

[15 of 17 positions shown; findings below may reference images not displayed]

OBSTETRICS REPORT
(Signed Final 03/05/2015 [DATE])

Date:

Service(s) Provided

US MFM UA CORD DOPPLER                                 76820.02
Indications

37 weeks gestation of pregnancy
Maternal care for known of suspected poor fetal
growth, third trimester, not applicable or
unspecified (EFW 17th%tile; AC < 3rd %tile)
Cervical shortening, 3rd (on progesterone, BMZ
[DATE], [DATE])
Uterine fibroids affecting pregnancy in third          O34.13,
trimester, antepartum
Fetal Evaluation

Num Of             1
Fetuses:
Fetal Heart        135                          bpm
Rate:
Cardiac Activity:  Observed
Presentation:      Cephalic

Amniotic Fluid
AFI FV:      Subjectively within normal limits
AFI Sum:     25.34    cm      96  %Tile     Larg Pckt:    7.47   cm
RUQ:   7.47    cm    RLQ:   5.51    cm   LUQ:    5.81    cm   LLQ:    6.55   cm
Biophysical Evaluation

Amniotic F.V:   Within normal limits        F. Breathing:   Observed
F. Movement:    Observed                    Score:          [DATE]
Gestational Age

LMP:           37w 1d        Date:  06/18/14                  EDD:   03/25/15
Best:          37w 1d    Det. By:   LMP  (06/18/14)           EDD:   03/25/15
Doppler - Fetal Vessels
Umbilical Artery
S/D:   2.73           70   %tile
Umbilical Artery
Absent DFV:     No    Reverse         No
DFV:

Impression

Single living intrauterine pregnancy at 37 weeks 1 day.
Borderline high amniotic fluid volume.
BPP [DATE].
Normal umbilical artery Dopplers.
Recommendations

Ms. Espe is scheduled for an induction of labor next
week.

SIMYS with us.  Please do not hesitate to contact

## 2017-06-01 DIAGNOSIS — Z6829 Body mass index (BMI) 29.0-29.9, adult: Secondary | ICD-10-CM | POA: Diagnosis not present

## 2017-06-01 DIAGNOSIS — Z01419 Encounter for gynecological examination (general) (routine) without abnormal findings: Secondary | ICD-10-CM | POA: Diagnosis not present

## 2017-06-26 DIAGNOSIS — N921 Excessive and frequent menstruation with irregular cycle: Secondary | ICD-10-CM | POA: Diagnosis not present

## 2017-07-19 DIAGNOSIS — H5213 Myopia, bilateral: Secondary | ICD-10-CM | POA: Diagnosis not present

## 2017-12-18 ENCOUNTER — Ambulatory Visit (INDEPENDENT_AMBULATORY_CARE_PROVIDER_SITE_OTHER): Payer: 59 | Admitting: Family Medicine

## 2017-12-18 ENCOUNTER — Encounter: Payer: Self-pay | Admitting: Family Medicine

## 2017-12-18 VITALS — BP 106/60 | HR 45 | Temp 98.4°F | Ht 64.75 in | Wt 184.6 lb

## 2017-12-18 DIAGNOSIS — Z13220 Encounter for screening for lipoid disorders: Secondary | ICD-10-CM | POA: Diagnosis not present

## 2017-12-18 DIAGNOSIS — Z23 Encounter for immunization: Secondary | ICD-10-CM

## 2017-12-18 DIAGNOSIS — Z1329 Encounter for screening for other suspected endocrine disorder: Secondary | ICD-10-CM | POA: Diagnosis not present

## 2017-12-18 DIAGNOSIS — Z01419 Encounter for gynecological examination (general) (routine) without abnormal findings: Secondary | ICD-10-CM | POA: Diagnosis not present

## 2017-12-18 DIAGNOSIS — E049 Nontoxic goiter, unspecified: Secondary | ICD-10-CM | POA: Insufficient documentation

## 2017-12-18 DIAGNOSIS — N921 Excessive and frequent menstruation with irregular cycle: Secondary | ICD-10-CM | POA: Diagnosis not present

## 2017-12-18 LAB — COMPREHENSIVE METABOLIC PANEL
ALT: 13 U/L (ref 0–35)
AST: 14 U/L (ref 0–37)
Albumin: 4.3 g/dL (ref 3.5–5.2)
Alkaline Phosphatase: 50 U/L (ref 39–117)
BUN: 11 mg/dL (ref 6–23)
CHLORIDE: 101 meq/L (ref 96–112)
CO2: 31 meq/L (ref 19–32)
CREATININE: 0.74 mg/dL (ref 0.40–1.20)
Calcium: 9.6 mg/dL (ref 8.4–10.5)
GFR: 94.44 mL/min (ref >60.00)
GLUCOSE: 83 mg/dL (ref 70–99)
Potassium: 3.9 mEq/L (ref 3.5–5.1)
Sodium: 138 mEq/L (ref 135–145)
Total Bilirubin: 1.1 mg/dL (ref 0.2–1.2)
Total Protein: 7.2 g/dL (ref 6.0–8.3)

## 2017-12-18 LAB — CBC WITH DIFFERENTIAL/PLATELET
BASOS PCT: 0.8 % (ref 0.0–3.0)
Basophils Absolute: 0 10*3/uL (ref 0.0–0.1)
Eosinophils Absolute: 0 10*3/uL (ref 0.0–0.7)
Eosinophils Relative: 1 % (ref 0.0–5.0)
HCT: 40.8 % (ref 36.0–46.0)
HEMOGLOBIN: 13.4 g/dL (ref 12.0–15.0)
Lymphocytes Relative: 45.9 % (ref 12.0–46.0)
Lymphs Abs: 2 10*3/uL (ref 0.7–4.0)
MCHC: 32.8 g/dL (ref 30.0–36.0)
MCV: 87.8 fl (ref 78.0–100.0)
MONO ABS: 0.4 10*3/uL (ref 0.1–1.0)
Monocytes Relative: 9.7 % (ref 3.0–12.0)
NEUTROS ABS: 1.9 10*3/uL (ref 1.4–7.7)
NEUTROS PCT: 42.6 % — AB (ref 43.0–77.0)
PLATELETS: 229 10*3/uL (ref 150.0–400.0)
RBC: 4.65 Mil/uL (ref 3.87–5.11)
RDW: 14.3 % (ref 11.5–15.5)
WBC: 4.4 10*3/uL (ref 4.0–10.5)

## 2017-12-18 LAB — LIPID PANEL
CHOL/HDL RATIO: 2
Cholesterol: 193 mg/dL (ref 0–200)
HDL: 84.7 mg/dL (ref >39.00)
LDL CALC: 95 mg/dL (ref 0–99)
NonHDL: 108.72
Triglycerides: 69 mg/dL (ref 0.0–149.0)
VLDL: 13.8 mg/dL (ref 0.0–40.0)

## 2017-12-18 LAB — T4, FREE: FREE T4: 0.86 ng/dL (ref 0.60–1.60)

## 2017-12-18 LAB — TSH: TSH: 2.41 u[IU]/mL (ref 0.35–4.50)

## 2017-12-18 NOTE — Assessment & Plan Note (Signed)
Reviewed preventive care protocols, scheduled due services, and updated immunizations Discussed nutrition, exercise, diet, and healthy lifestyle.  

## 2017-12-18 NOTE — Assessment & Plan Note (Signed)
Pt unaware of goiter- reports no diffculty swallowing or abnormal sensation in her neck.  Denies any symptoms of hypo or hyperthyroidism. Will check TSH, FT4 today. Advised her to ask her physician husband if he has noticed her goiter in past- consider thyroid US. The patient indicates understanding of these issues and agrees with the plan.

## 2017-12-18 NOTE — Progress Notes (Signed)
Subjective:   Patient ID: Lakelynn Severtson, female    DOB: 23-Aug-1980, 37 y.o.   MRN: [id number]  Thea Holshouser is a pleasant 37 y.o. year old female who presents to clinic today with New Patient (Initial Visit) (Patient is here today to establish care.  She has eaten this morning.  She has a GYN Dr. Minna Merritts at [facility] and had PAP in [place] and WNL. She agrees to get Tdap today.  She has on her paperwork that she has irregular menstrual cycles due to hormones.  Has not had a PCP since moving to the [place].)  on 12/18/2017  HPI:  Patient is here today to establish care. She has eaten this morning. She has a GYN Dr. Minna Merritts at [facility] and had PAP in [place] and WNL. She agrees to get Tdap today. She has on her paperwork that she has irregular menstrual cycles due to hormones. Has not had a PCP since moving to the [place].  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 Her husband works nights as a Psychologist, educational. No current outpatient medications on file prior to visit.   No current facility-administered medications on file prior to visit.     No Known Allergies  Past Medical History:  Diagnosis Date  . Hx of adenoidectomy   . 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 ADENOIDECTOMY    . HYMENECTOMY N/A 04/15/2013   Procedure: HYMENECTOMY;  Surgeon: Linda Hedges, DO;  Location: [facility];  Service: Gynecology;  Laterality: N/A;    Family History  Problem Relation Age of Onset  . Seizures Cousin   . Cancer Cousin        uterine  . Diabetes Father   . Heart disease Father   . Arthritis Father   . Heart attack Father   . Hypertension Maternal Aunt   . Hypertension Maternal Uncle   . Diabetes Paternal Aunt   . Arthritis Mother   . Asthma Mother   . Mental illness Maternal Grandmother   . Early death Paternal Grandmother     Social History   Socioeconomic History  . Marital status: Married    Spouse  name: Not on file  . Number of children: Not on file  . Years of education: Not on file  . Highest education level: Not on file  Occupational History  . Not on file  Social Needs  . Financial resource strain: Not on file  . Food insecurity:    Worry: Not on file    Inability: Not on file  . Transportation needs:    Medical: Not on file    Non-medical: Not on file  Tobacco Use  . Smoking status: Never Smoker  . Smokeless tobacco: Never Used  Substance and Sexual Activity  . Alcohol use: No  . Drug use: No  . Sexual activity: Yes    Birth control/protection: None  Lifestyle  . Physical activity:    Days per week: Not on file    Minutes per session: Not on file  . Stress: Not on file  Relationships  . Social connections:    Talks on phone: Not on file    Gets together: Not on file    Attends religious service: Not on file    Active member of club or organization: Not on file    Attends meetings of clubs or organizations: Not on file  Relationship status: Not on file  . Intimate partner violence:    Fear of current or ex partner: Not on file    Emotionally abused: Not on file    Physically abused: Not on file    Forced sexual activity: Not on file  Other Topics Concern  . Not on file  Social History Narrative  . Not on file   The PMH, PSH, Social History, Family History, Medications, and allergies have been reviewed in [facility], and have been updated if relevant.   Review of Systems  Constitutional: Negative.   HENT: Negative.   Eyes: Negative.   Respiratory: Negative.   Cardiovascular: Negative.   Gastrointestinal: Negative.   Endocrine: Negative.   Genitourinary: Negative.   Musculoskeletal: Negative.   Skin: Negative.   Allergic/Immunologic: Negative.   Neurological: Negative.   Hematological: Negative.   Psychiatric/Behavioral: Negative.   All other systems reviewed and are negative.      Objective:    BP 106/60 (BP Location: Left Arm, Patient  Position: Sitting, Cuff Size: Normal)   Pulse (!) 45   Temp 98.4 F (36.9 C) (Oral)   Ht 5' 4.75" (1.645 m)   Wt 184 lb 9.6 oz (83.7 kg)   LMP 11/19/2017 (Exact Date)   SpO2 97%   BMI 30.96 kg/m    Physical Exam  Constitutional: She is oriented to person, place, and time. She appears well-developed and well-nourished.  HENT:  Head: Normocephalic and atraumatic.  Eyes: Pupils are equal, round, and reactive to light. EOM are normal.  Neck: Thyromegaly present.  Cardiovascular: Normal rate, regular rhythm and normal heart sounds.  Pulmonary/Chest: Effort normal and breath sounds normal. No respiratory distress.  Abdominal: Soft. Bowel sounds are normal.  Musculoskeletal: Normal range of motion. She exhibits no edema.  Neurological: She is alert and oriented to person, place, and time. She displays normal reflexes. No cranial nerve deficit. Coordination normal.  Skin: Skin is warm and dry.  Psychiatric: She has a normal mood and affect. Her behavior is normal. Judgment and thought content normal.  Nursing note and vitals reviewed.         Assessment & Plan:   No diagnosis found. No follow-ups on file.

## 2017-12-18 NOTE — Patient Instructions (Addendum)
Great to meet you! I will call you with your lab results from today and you can view them online.   Please ask your husband to feel your thyroid from time to time.  If it remains larger - we can get an ultrasound.

## 2018-03-29 ENCOUNTER — Ambulatory Visit: Payer: 59 | Admitting: Family Medicine

## 2018-04-09 ENCOUNTER — Ambulatory Visit (INDEPENDENT_AMBULATORY_CARE_PROVIDER_SITE_OTHER): Payer: 59 | Admitting: Family Medicine

## 2018-04-09 VITALS — BP 106/66 | HR 48 | Temp 98.1°F | Ht 64.75 in | Wt 182.4 lb

## 2018-04-09 DIAGNOSIS — L989 Disorder of the skin and subcutaneous tissue, unspecified: Secondary | ICD-10-CM | POA: Diagnosis not present

## 2018-04-09 NOTE — Progress Notes (Signed)
Subjective:   Patient ID: Brooke Mendoza, female    DOB: [birth year], 37 y.o.   MRN: [id number]  Brooke Mendoza is a pleasant 37 y.o. year old female who presents to clinic today with skin lesions (since the beginning of the year she has had possible warts on her head and in L ear. No pain noted.)  on 04/09/2018  HPI:  She has several growth on her body and not sure what they are.  ? Warts?  Right frontal scalp- small, round, non tender.  She does not think it has grown in size.  Noticed it several months ago.  Left ear canal- she can feel a bump- non tender.    She sometimes does get painful bumps in her groin, buttocks and armpits.  But she currently does not have any of those.   No current outpatient medications on file prior to visit.   No current facility-administered medications on file prior to visit.     No Known Allergies  Past Medical History:  Diagnosis Date  . Hx of adenoidectomy   . 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 ADENOIDECTOMY    . HYMENECTOMY N/A 04/15/2013   Procedure: HYMENECTOMY;  Surgeon: Linda Hedges, DO;  Location: [facility];  Service: Gynecology;  Laterality: N/A;    Family History  Problem Relation Age of Onset  . Seizures Cousin   . Cancer Cousin        uterine  . Diabetes Father   . Heart disease Father   . Arthritis Father   . Heart attack Father   . Hypertension Maternal Aunt   . Hypertension Maternal Uncle   . Diabetes Paternal Aunt   . Arthritis Mother   . Asthma Mother   . Mental illness Maternal Grandmother   . Early death Paternal Grandmother     Social History   Socioeconomic History  . Marital status: Married    Spouse name: Not on file  . Number of children: Not on file  . Years of education: Not on file  . Highest education level: Not on file  Occupational History  . Not on file  Social Needs  . Financial resource strain: Not on file  . Food  insecurity:    Worry: Not on file    Inability: Not on file  . Transportation needs:    Medical: Not on file    Non-medical: Not on file  Tobacco Use  . Smoking status: Never Smoker  . Smokeless tobacco: Never Used  Substance and Sexual Activity  . Alcohol use: No  . Drug use: No  . Sexual activity: Yes    Birth control/protection: None  Lifestyle  . Physical activity:    Days per week: Not on file    Minutes per session: Not on file  . Stress: Not on file  Relationships  . Social connections:    Talks on phone: Not on file    Gets together: Not on file    Attends religious service: Not on file    Active member of club or organization: Not on file    Attends meetings of clubs or organizations: Not on file    Relationship status: Not on file  . Intimate partner violence:    Fear of current or ex partner: Not on file    Emotionally abused: Not on file    Physically abused: Not on file    Forced sexual activity: Not  on file  Other Topics Concern  . Not on file  Social History Narrative  . Not on file   The PMH, PSH, Social History, Family History, Medications, and allergies have been reviewed in [facility], and have been updated if relevant.   Review of Systems  Constitutional: Negative.   Skin: Negative for color change, rash and wound.  Allergic/Immunologic: Negative.   All other systems reviewed and are negative.      Objective:    BP 106/66 (BP Location: Left Arm, Patient Position: Sitting, Cuff Size: Normal)   Pulse (!) 48   Temp 98.1 F (36.7 C) (Oral)   Ht 5' 4.75" (1.645 m)   Wt 182 lb 6.4 oz (82.7 kg)   SpO2 98%   BMI 30.59 kg/m    Physical Exam  Constitutional: She is oriented to person, place, and time. She appears well-developed and well-nourished. No distress.  HENT:  Head:    Eyes: EOM are normal.  Neck: Normal range of motion.  Cardiovascular: Normal rate.  Pulmonary/Chest: Effort normal.  Musculoskeletal: Normal range of motion.    Neurological: She is alert and oriented to person, place, and time. No cranial nerve deficit.  Skin: She is not diaphoretic.  Small firm, flesh color raised lesion, left ear canal, not warm or tender, not draining  Psychiatric: She has a normal mood and affect. Her behavior is normal. Thought content normal.  Nursing note and vitals reviewed.         Assessment & Plan:   Skin lesions - Plan: Ambulatory referral to Dermatology No follow-ups on file.

## 2018-04-09 NOTE — Assessment & Plan Note (Signed)
Scalp lesion- consistent with cyst. Left ear canal- ? Possible cyst. Other lesions, currently which she does not have, seem consistent with hidradenitis. Refer to derm for ear lesion and discussed hidradenitis. See AVS.

## 2018-04-09 NOTE — Patient Instructions (Signed)
Hidradenitis Suppurativa Hidradenitis suppurativa is a long-term (chronic) skin disease that starts with blocked sweat glands or hair follicles. Bacteria may grow in these blocked openings of your skin. Hidradenitis suppurativa is like a severe form of acne that develops in areas of your body where acne would be unusual. It is most likely to affect the areas of your body where skin rubs against skin and becomes moist. This includes your:  Underarms.  Groin.  Genital areas.  Buttocks.  Upper thighs.  Breasts.  Hidradenitis suppurativa may start out with small pimples. The pimples can develop into deep sores that break open (rupture) and drain pus. Over time your skin may thicken and become scarred. Hidradenitis suppurativa cannot be passed from person to person. What are the causes? The exact cause of hidradenitis suppurativa is not known. This condition may be due to:  Female and female hormones. The condition is rare before and after puberty.  An overactive body defense system (immune system). Your immune system may overreact to the blocked hair follicles or sweat glands and cause swelling and pus-filled sores.  What increases the risk? You may have a higher risk of hidradenitis suppurativa if you:  Are a woman.  Are between ages 11 and 55.  Have a family history of hidradenitis suppurativa.  Have a personal history of acne.  Are overweight.  Smoke.  Take the drug lithium.  What are the signs or symptoms? The first signs of an outbreak are usually painful skin bumps that look like pimples. As the condition progresses:  Skin bumps may get bigger and grow deeper into the skin.  Bumps under the skin may rupture and drain smelly pus.  Skin may become itchy and infected.  Skin may thicken and scar.  Drainage may continue through tunnels under the skin (fistulas).  Walking and moving your arms can become painful.  How is this diagnosed? Your health care provider may  diagnose hidradenitis suppurativa based on your medical history and your signs and symptoms. A physical exam will also be done. You may need to see a health care provider who specializes in skin diseases (dermatologist). You may also have tests done to confirm the diagnosis. These can include:  Swabbing a sample of pus or drainage from your skin so it can be sent to the lab and tested for infection.  Blood tests to check for infection.  How is this treated? The same treatment will not work for everybody with hidradenitis suppurativa. Your treatment will depend on how severe your symptoms are. You may need to try several treatments to find what works best for you. Part of your treatment may include cleaning and bandaging (dressing) your wounds. You may also have to take medicines, such as the following:  Antibiotics.  Acne medicines.  Medicines to block or suppress the immune system.  A diabetes medicine (metformin) is sometimes used to treat this condition.  For women, birth control pills can sometimes help relieve symptoms.  You may need surgery if you have a severe case of hidradenitis suppurativa that does not respond to medicine. Surgery may involve:  Using a laser to clear the skin and remove hair follicles.  Opening and draining deep sores.  Removing the areas of skin that are diseased and scarred.  Follow these instructions at home:  Learn as much as you can about your disease, and work closely with your health care providers.  Take medicines only as directed by your health care provider.  If you were prescribed   an antibiotic medicine, finish it all even if you start to feel better.  If you are overweight, losing weight may be very helpful. Try to reach and maintain a healthy weight.  Do not use any tobacco products, including cigarettes, chewing tobacco, or electronic cigarettes. If you need help quitting, ask your health care provider.  Do not shave the areas where you  get hidradenitis suppurativa.  Do not wear deodorant.  Wear loose-fitting clothes.  Try not to overheat and get sweaty.  Take a daily bleach bath as directed by your health care provider. ? Fill your bathtub halfway with water. ? Pour in  cup of unscented household bleach. ? Soak for 5-10 minutes.  Cover sore areas with a warm, clean washcloth (compress) for 5-10 minutes. Contact a health care provider if:  You have a flare-up of hidradenitis suppurativa.  You have chills or a fever.  You are having trouble controlling your symptoms at home. This information is not intended to replace advice given to you by your health care provider. Make sure you discuss any questions you have with your health care provider. Document Released: 01/19/2004 Document Revised: 11/12/2015 Document Reviewed: 09/06/2013 Elsevier Interactive Patient Education  2018 Elsevier Inc.  

## 2018-05-26 DIAGNOSIS — L7211 Pilar cyst: Secondary | ICD-10-CM | POA: Diagnosis not present

## 2018-06-27 DIAGNOSIS — Z6830 Body mass index (BMI) 30.0-30.9, adult: Secondary | ICD-10-CM | POA: Diagnosis not present

## 2018-06-27 DIAGNOSIS — Z01419 Encounter for gynecological examination (general) (routine) without abnormal findings: Secondary | ICD-10-CM | POA: Diagnosis not present

## 2018-07-19 DIAGNOSIS — H5213 Myopia, bilateral: Secondary | ICD-10-CM | POA: Diagnosis not present

## 2018-12-23 ENCOUNTER — Emergency Department (HOSPITAL_COMMUNITY): Payer: 59

## 2018-12-23 ENCOUNTER — Emergency Department (HOSPITAL_COMMUNITY)
Admission: EM | Admit: 2018-12-23 | Discharge: 2018-12-23 | Disposition: A | Discharge status: Home / Self Care | Payer: 59 | Attending: Emergency Medicine | Admitting: Emergency Medicine

## 2018-12-23 ENCOUNTER — Other Ambulatory Visit: Payer: Self-pay

## 2018-12-23 ENCOUNTER — Encounter (HOSPITAL_COMMUNITY): Payer: Self-pay

## 2018-12-23 DIAGNOSIS — S59912A Unspecified injury of left forearm, initial encounter: Secondary | ICD-10-CM | POA: Diagnosis not present

## 2018-12-23 DIAGNOSIS — S63634S Sprain of interphalangeal joint of right ring finger, sequela: Secondary | ICD-10-CM | POA: Diagnosis not present

## 2018-12-23 DIAGNOSIS — M79632 Pain in left forearm: Secondary | ICD-10-CM | POA: Diagnosis not present

## 2018-12-23 DIAGNOSIS — Y999 Unspecified external cause status: Secondary | ICD-10-CM | POA: Insufficient documentation

## 2018-12-23 DIAGNOSIS — S63636A Sprain of interphalangeal joint of right little finger, initial encounter: Secondary | ICD-10-CM | POA: Diagnosis not present

## 2018-12-23 DIAGNOSIS — Y92414 Local residential or business street as the place of occurrence of the external cause: Secondary | ICD-10-CM | POA: Insufficient documentation

## 2018-12-23 DIAGNOSIS — S5012XA Contusion of left forearm, initial encounter: Secondary | ICD-10-CM | POA: Insufficient documentation

## 2018-12-23 DIAGNOSIS — Y9389 Activity, other specified: Secondary | ICD-10-CM | POA: Insufficient documentation

## 2018-12-23 DIAGNOSIS — M79644 Pain in right finger(s): Secondary | ICD-10-CM | POA: Diagnosis not present

## 2018-12-23 DIAGNOSIS — S63634A Sprain of interphalangeal joint of right ring finger, initial encounter: Secondary | ICD-10-CM | POA: Diagnosis not present

## 2018-12-23 DIAGNOSIS — S6991XA Unspecified injury of right wrist, hand and finger(s), initial encounter: Secondary | ICD-10-CM | POA: Diagnosis present

## 2018-12-23 DIAGNOSIS — R58 Hemorrhage, not elsewhere classified: Secondary | ICD-10-CM | POA: Diagnosis not present

## 2018-12-23 NOTE — ED Notes (Signed)
Pt states HR, and BP are baseline for her.
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
## 2019-08-06 DIAGNOSIS — H5213 Myopia, bilateral: Secondary | ICD-10-CM | POA: Diagnosis not present

## 2019-08-22 DIAGNOSIS — Z01419 Encounter for gynecological examination (general) (routine) without abnormal findings: Secondary | ICD-10-CM | POA: Diagnosis not present

## 2019-08-22 DIAGNOSIS — Z6832 Body mass index (BMI) 32.0-32.9, adult: Secondary | ICD-10-CM | POA: Diagnosis not present

## 2019-08-30 ENCOUNTER — Ambulatory Visit: Payer: 59 | Attending: Internal Medicine

## 2019-08-30 DIAGNOSIS — Z23 Encounter for immunization: Secondary | ICD-10-CM

## 2019-08-30 NOTE — Progress Notes (Signed)
   Covid-19 Vaccination Clinic  Name:  Rashelle Geschke    MRN: [id number] DOB: 09-29-1980  08/30/2019  Ms. Mankins was observed post Covid-19 immunization for 15 minutes without incident. She was provided with Vaccine Information Sheet and instruction to access the V-Safe system.   Ms. Ganzer was instructed to call 911 with any severe reactions post vaccine: Marland Kitchen Difficulty breathing  . Swelling of face and throat  . A fast heartbeat  . A bad rash all over body  . Dizziness and weakness   Immunizations Administered    Name Date Dose VIS Date Route   Moderna COVID-19 Vaccine 08/30/2019 11:25 AM 0.5 mL 05/21/2019 Intramuscular   Manufacturer: Moderna   Lot: [id number]   [id number]

## 2019-10-02 ENCOUNTER — Ambulatory Visit: Payer: 59

## 2019-10-09 ENCOUNTER — Ambulatory Visit: Payer: 59 | Attending: Internal Medicine

## 2019-10-09 DIAGNOSIS — Z23 Encounter for immunization: Secondary | ICD-10-CM

## 2019-10-09 NOTE — Progress Notes (Signed)
   Covid-19 Vaccination Clinic  Name:  Brooke Mendoza    MRN: [id number] DOB: 1981/03/12  10/09/2019  Brooke Mendoza was observed post Covid-19 immunization for 15 minutes without incident. She was provided with Vaccine Information Sheet and instruction to access the V-Safe system.   Brooke Mendoza was instructed to call 911 with any severe reactions post vaccine: Marland Kitchen Difficulty breathing  . Swelling of face and throat  . A fast heartbeat  . A bad rash all over body  . Dizziness and weakness   Immunizations Administered    Name Date Dose VIS Date Route   Moderna COVID-19 Vaccine 10/09/2019 10:59 AM 0.5 mL 05/2019 Intramuscular   Manufacturer: Moderna   Lot: [id number]   [id number]

## 2019-11-07 DIAGNOSIS — F4323 Adjustment disorder with mixed anxiety and depressed mood: Secondary | ICD-10-CM | POA: Diagnosis not present

## 2019-11-28 DIAGNOSIS — F4323 Adjustment disorder with mixed anxiety and depressed mood: Secondary | ICD-10-CM | POA: Diagnosis not present

## 2019-12-19 DIAGNOSIS — F4323 Adjustment disorder with mixed anxiety and depressed mood: Secondary | ICD-10-CM | POA: Diagnosis not present

## 2020-01-09 DIAGNOSIS — F4323 Adjustment disorder with mixed anxiety and depressed mood: Secondary | ICD-10-CM | POA: Diagnosis not present

## 2020-02-13 DIAGNOSIS — F4323 Adjustment disorder with mixed anxiety and depressed mood: Secondary | ICD-10-CM | POA: Diagnosis not present

## 2020-03-04 DIAGNOSIS — F4323 Adjustment disorder with mixed anxiety and depressed mood: Secondary | ICD-10-CM | POA: Diagnosis not present

## 2020-03-13 DIAGNOSIS — H16041 Marginal corneal ulcer, right eye: Secondary | ICD-10-CM | POA: Diagnosis not present

## 2020-03-17 DIAGNOSIS — H16041 Marginal corneal ulcer, right eye: Secondary | ICD-10-CM | POA: Diagnosis not present

## 2020-04-01 DIAGNOSIS — F4323 Adjustment disorder with mixed anxiety and depressed mood: Secondary | ICD-10-CM | POA: Diagnosis not present

## 2020-04-09 DIAGNOSIS — H10413 Chronic giant papillary conjunctivitis, bilateral: Secondary | ICD-10-CM | POA: Diagnosis not present

## 2020-04-29 DIAGNOSIS — F4323 Adjustment disorder with mixed anxiety and depressed mood: Secondary | ICD-10-CM | POA: Diagnosis not present

## 2020-05-06 DIAGNOSIS — F4323 Adjustment disorder with mixed anxiety and depressed mood: Secondary | ICD-10-CM | POA: Diagnosis not present

## 2020-05-20 DIAGNOSIS — F4323 Adjustment disorder with mixed anxiety and depressed mood: Secondary | ICD-10-CM | POA: Diagnosis not present

## 2020-07-08 DIAGNOSIS — F4323 Adjustment disorder with mixed anxiety and depressed mood: Secondary | ICD-10-CM | POA: Diagnosis not present

## 2020-09-16 DIAGNOSIS — Z6833 Body mass index (BMI) 33.0-33.9, adult: Secondary | ICD-10-CM | POA: Diagnosis not present

## 2020-09-16 DIAGNOSIS — Z01419 Encounter for gynecological examination (general) (routine) without abnormal findings: Secondary | ICD-10-CM | POA: Diagnosis not present

## 2020-10-29 DIAGNOSIS — H5213 Myopia, bilateral: Secondary | ICD-10-CM | POA: Diagnosis not present

## 2021-07-23 DIAGNOSIS — F4323 Adjustment disorder with mixed anxiety and depressed mood: Secondary | ICD-10-CM | POA: Diagnosis not present

## 2021-11-04 DIAGNOSIS — H5213 Myopia, bilateral: Secondary | ICD-10-CM | POA: Diagnosis not present

## 2021-11-18 ENCOUNTER — Other Ambulatory Visit: Payer: Self-pay

## 2021-11-18 ENCOUNTER — Encounter (HOSPITAL_BASED_OUTPATIENT_CLINIC_OR_DEPARTMENT_OTHER): Payer: Self-pay | Admitting: *Deleted

## 2021-11-18 ENCOUNTER — Emergency Department (HOSPITAL_BASED_OUTPATIENT_CLINIC_OR_DEPARTMENT_OTHER)
Admission: EM | Admit: 2021-11-18 | Discharge: 2021-11-19 | Disposition: A | Discharge status: Home / Self Care | Payer: 59 | Attending: Emergency Medicine | Admitting: Emergency Medicine

## 2021-11-18 DIAGNOSIS — R1032 Left lower quadrant pain: Secondary | ICD-10-CM | POA: Diagnosis present

## 2021-11-18 DIAGNOSIS — K5901 Slow transit constipation: Secondary | ICD-10-CM | POA: Diagnosis not present

## 2021-11-18 LAB — CBC WITH DIFFERENTIAL/PLATELET
Abs Immature Granulocytes: 0.02 10*3/uL (ref 0.00–0.07)
Basophils Absolute: 0 10*3/uL (ref 0.0–0.1)
Basophils Relative: 0 %
Eosinophils Absolute: 0.2 10*3/uL (ref 0.0–0.5)
Eosinophils Relative: 3 %
HCT: 38.4 % (ref 36.0–46.0)
Hemoglobin: 12.5 g/dL (ref 12.0–15.0)
Immature Granulocytes: 0 %
Lymphocytes Relative: 33 %
Lymphs Abs: 2.8 10*3/uL (ref 0.7–4.0)
MCH: 29 pg (ref 26.0–34.0)
MCHC: 32.6 g/dL (ref 30.0–36.0)
MCV: 89.1 fL (ref 80.0–100.0)
Monocytes Absolute: 0.8 10*3/uL (ref 0.1–1.0)
Monocytes Relative: 9 %
Neutro Abs: 4.6 10*3/uL (ref 1.7–7.7)
Neutrophils Relative %: 55 %
Platelets: 243 10*3/uL (ref 150–400)
RBC: 4.31 MIL/uL (ref 3.87–5.11)
RDW: 13.6 % (ref 11.5–15.5)
WBC: 8.4 10*3/uL (ref 4.0–10.5)
nRBC: 0 % (ref 0.0–0.2)

## 2021-11-18 LAB — URINALYSIS, ROUTINE W REFLEX MICROSCOPIC
Bilirubin Urine: NEGATIVE
Glucose, UA: NEGATIVE mg/dL
Hgb urine dipstick: NEGATIVE
Ketones, ur: NEGATIVE mg/dL
Leukocytes,Ua: NEGATIVE
Nitrite: NEGATIVE
Protein, ur: NEGATIVE mg/dL
Specific Gravity, Urine: 1.03 (ref 1.005–1.030)
pH: 5.5 (ref 5.0–8.0)

## 2021-11-18 NOTE — ED Triage Notes (Signed)
Pt is here for left upper back pain which began this afternoon and this pm she began having abdominal bloating. Pt reports that she has had this before but she can not recall what was done.

## 2021-11-19 ENCOUNTER — Emergency Department (HOSPITAL_BASED_OUTPATIENT_CLINIC_OR_DEPARTMENT_OTHER): Payer: 59

## 2021-11-19 ENCOUNTER — Other Ambulatory Visit (HOSPITAL_COMMUNITY): Payer: Self-pay

## 2021-11-19 DIAGNOSIS — N3289 Other specified disorders of bladder: Secondary | ICD-10-CM | POA: Diagnosis not present

## 2021-11-19 DIAGNOSIS — K5901 Slow transit constipation: Secondary | ICD-10-CM | POA: Diagnosis not present

## 2021-11-19 LAB — COMPREHENSIVE METABOLIC PANEL
ALT: 20 U/L (ref 0–44)
AST: 18 U/L (ref 15–41)
Albumin: 4.1 g/dL (ref 3.5–5.0)
Alkaline Phosphatase: 47 U/L (ref 38–126)
Anion gap: 7 (ref 5–15)
BUN: 17 mg/dL (ref 6–20)
CO2: 28 mmol/L (ref 22–32)
Calcium: 9.2 mg/dL (ref 8.9–10.3)
Chloride: 102 mmol/L (ref 98–111)
Creatinine, Ser: 0.75 mg/dL (ref 0.44–1.00)
GFR, Estimated: >60 mL/min (ref >60)
Glucose, Bld: 103 mg/dL — ABNORMAL HIGH (ref 70–99)
Potassium: 3.4 mmol/L — ABNORMAL LOW (ref 3.5–5.1)
Sodium: 137 mmol/L (ref 135–145)
Total Bilirubin: 0.7 mg/dL (ref 0.3–1.2)
Total Protein: 7.3 g/dL (ref 6.5–8.1)

## 2021-11-19 LAB — PREGNANCY, URINE: Preg Test, Ur: NEGATIVE

## 2021-11-19 LAB — LIPASE, BLOOD: Lipase: 69 U/L — ABNORMAL HIGH (ref 11–51)

## 2021-11-19 MED ORDER — IOHEXOL 300 MG/ML  SOLN
100.0000 mL | Freq: Once | INTRAMUSCULAR | Status: AC | PRN
  Administered 2021-11-19: 100 mL via INTRAVENOUS

## 2021-11-19 MED ORDER — POLYETHYLENE GLYCOL 3350 17 G PO PACK
17.0000 g | PACK | Freq: Every day | ORAL | 1 refills | Status: AC
  Filled 2021-11-19: qty 30, 30d supply, fill #0

## 2021-11-19 NOTE — Discharge Instructions (Addendum)
You were evaluated in the Emergency Department and after careful evaluation, we did not find any emergent condition requiring admission or further testing in the hospital.  Your exam/testing today is overall reassuring.  Symptoms seem to be due to constipation, possibly due to slow transit.  Recommend follow-up with the gastroenterologist.  Can try the MiraLAX at home to help with bowel movements.  Please return to the Emergency Department if you experience any worsening of your condition.   Thank you for allowing [place] to be a part of your care.

## 2021-11-19 NOTE — ED Notes (Signed)
Pt agreeable with d/c plan as discussed by provider- this nurse has verbally reinforced d/c instructions and provided pt with written copy- pt acknowledges verbal understanding and denies any additional questions, concerns, needs- ambulatory independently at d/c; gait steady; vitals stable; no distress.

## 2021-11-19 NOTE — ED Provider Notes (Signed)
[facility] Emergency Department Provider Note MRN:  [id number]  Arrival date & time: 11/19/21     Chief Complaint   Back Pain and Abdominal Pain   History of Present Illness   Brooke Mendoza is a 41 y.o. year-old female with no pertinent past medical presenting to the ED with chief complaint of abdominal.  Left flank pain for the past few days, today with diffuse abdominal bloating and discomfort.  Happened once before a long time ago.  Not passing any gas today.  Mild nausea, no vomiting, no fever.  Review of Systems  A thorough review of systems was obtained and all systems are negative except as noted in the HPI and PMH.   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    Past Surgical History:  Procedure Laterality Date   ADENOIDECTOMY     HYMENECTOMY N/A 04/15/2013   Procedure: HYMENECTOMY;  Surgeon: Linda Hedges, DO;  Location: [facility];  Service: Gynecology;  Laterality: N/A;    Family History  Problem Relation Age of Onset   Seizures Cousin    Cancer Cousin        uterine   Diabetes Father    Heart disease Father    Arthritis Father    Heart attack Father    Hypertension Maternal Aunt    Hypertension Maternal Uncle    Diabetes Paternal Aunt    Arthritis Mother    Asthma Mother    Mental illness Maternal Grandmother    Early death Paternal Grandmother     Social History   Socioeconomic History   Marital status: Married    Spouse name: Not on file   Number of children: Not on file   Years of education: Not on file   Highest education level: Not on file  Occupational History   Not on file  Tobacco Use   Smoking status: Never   Smokeless tobacco: Never  Vaping Use   Vaping Use: Never used  Substance and Sexual Activity   Alcohol use: No   Drug use: No   Sexual activity: Yes    Birth control/protection: None  Other Topics Concern    Not on file  Social History Narrative   Not on file   Social Determinants of Health   Financial Resource Strain: Not on file  Food Insecurity: Not on file  Transportation Needs: Not on file  Physical Activity: Not on file  Stress: Not on file  Social Connections: Not on file  Intimate Partner Violence: Not on file     Physical Exam   Vitals:   11/18/21 2302 11/19/21 0213  BP: (!) 127/95 119/74  Pulse: (!) 59 (!) 51  Resp: 16 15  Temp: 97.8 F (36.6 C)   SpO2: 100% 100%    CONSTITUTIONAL: Well-appearing, NAD NEURO/PSYCH:  Alert and oriented x 3, no focal deficits EYES:  eyes equal and reactive ENT/NECK:  no LAD, no JVD CARDIO: Regular rate, well-perfused, normal S1 and S2 PULM:  CTAB no wheezing or rhonchi GI/GU:  non-distended, non-tender MSK/SPINE:  No gross deformities, no edema SKIN:  no rash, atraumatic   *Additional and/or pertinent findings included in MDM below  Diagnostic and Interventional Summary    EKG Interpretation  Date/Time:    Ventricular Rate:    PR Interval:    QRS Duration:   QT Interval:    QTC Calculation:  R Axis:     Text Interpretation:         Labs Reviewed  COMPREHENSIVE METABOLIC PANEL - Abnormal; Notable for the following components:      Result Value   Potassium 3.4 (*)    Glucose, Bld 103 (*)    All other components within normal limits  LIPASE, BLOOD - Abnormal; Notable for the following components:   Lipase 69 (*)    All other components within normal limits  CBC WITH DIFFERENTIAL/PLATELET  URINALYSIS, ROUTINE W REFLEX MICROSCOPIC  PREGNANCY, URINE    CT ABDOMEN PELVIS W CONTRAST  Final Result      Medications  iohexol (OMNIPAQUE) 300 MG/ML solution 100 mL (100 mLs Intravenous Contrast Given 11/19/21 0113)     Procedures  /  Critical Care Procedures  ED Course and Medical Decision Making  Initial Impression and Ddx Differential diagnosis includes pyelonephritis, kidney stone, small bowel obstruction,  constipation.  Awaiting CT  Past medical/surgical history that increases complexity of ED encounter: None  Interpretation of Diagnostics I personally reviewed the laboratory assessment and my interpretation is as follows: No significant blood count or electrolyte disturbance  CT with evidence of slow transit constipation, no other emergent processes  Patient Reassessment and Ultimate Disposition/Management     Patient continues to look and feel well with normal vital signs, appropriate for discharge  Patient management required discussion with the following services or consulting groups:  None  Complexity of Problems Addressed Acute illness or injury that poses threat of life of bodily function  Additional Data Reviewed and Analyzed Further history obtained from: Further history from spouse/family member  Additional Factors Impacting ED Encounter Risk Prescriptions  Barth Kirks. [place]onia Small, [place] [email or web address]  Final Clinical Impressions(s) / ED Diagnoses     ICD-10-CM   1. Slow transit constipation  K59.01       ED Discharge Orders          Ordered    polyethylene glycol (MIRALAX) 17 g packet  Daily        11/19/21 0224             Discharge Instructions Discussed with and Provided to Patient:     Discharge Instructions      You were evaluated in the Emergency Department and after careful evaluation, we did not find any emergent condition requiring admission or further testing in the hospital.  Your exam/testing today is overall reassuring.  Symptoms seem to be due to constipation, possibly due to slow transit.  Recommend follow-up with the gastroenterologist.  Can try the MiraLAX at home to help with bowel movements.  Please return to the Emergency Department if you experience any worsening of your condition.   Thank you for allowing [place] to be a part of your care.       Maudie Flakes,  MD 11/19/21 [phone]

## 2021-11-19 NOTE — ED Notes (Signed)
Late entry-- Entered room to find pt sitting up to chairside-- pt alert and oriented x4.  Able to xfer independently from chair to stretcher; observed to be guarding L flank.  Denies abdominal pain and nausea- states only ongoing abd bloating.  Also denies urinary complaints and denies any issues with last BM (11/18/2021).  Pt also denies recent injury/trauma but states has recently resumed working out again.

## 2022-06-10 ENCOUNTER — Other Ambulatory Visit (HOSPITAL_COMMUNITY): Payer: Self-pay
